# Patient Record
Sex: Female | Born: 1985 | Race: White | Hispanic: Yes | Marital: Married | Smoking: Never smoker
Health system: Southern US, Community
[De-identification: ages and names within clinical notes are randomized; demographics above are authoritative.]

## PROBLEM LIST (undated history)

## (undated) DIAGNOSIS — O009 Unspecified ectopic pregnancy without intrauterine pregnancy: Secondary | ICD-10-CM

## (undated) DIAGNOSIS — J45909 Unspecified asthma, uncomplicated: Secondary | ICD-10-CM

## (undated) HISTORY — PX: NO PAST SURGERIES: SHX2092

---

## 2015-01-11 ENCOUNTER — Encounter (HOSPITAL_COMMUNITY): Payer: Self-pay | Admitting: *Deleted

## 2015-01-11 ENCOUNTER — Inpatient Hospital Stay (HOSPITAL_COMMUNITY): Payer: Self-pay

## 2015-01-11 ENCOUNTER — Inpatient Hospital Stay (HOSPITAL_COMMUNITY)
Admission: AD | Admit: 2015-01-11 | Discharge: 2015-01-11 | Disposition: A | Discharge status: Home / Self Care | Payer: Self-pay | Source: Ambulatory Visit | Attending: Obstetrics & Gynecology | Admitting: Obstetrics & Gynecology

## 2015-01-11 DIAGNOSIS — O4691 Antepartum hemorrhage, unspecified, first trimester: Secondary | ICD-10-CM

## 2015-01-11 DIAGNOSIS — O3680X0 Pregnancy with inconclusive fetal viability, not applicable or unspecified: Secondary | ICD-10-CM

## 2015-01-11 DIAGNOSIS — Z3A01 Less than 8 weeks gestation of pregnancy: Secondary | ICD-10-CM | POA: Insufficient documentation

## 2015-01-11 DIAGNOSIS — R1030 Lower abdominal pain, unspecified: Secondary | ICD-10-CM | POA: Insufficient documentation

## 2015-01-11 DIAGNOSIS — O209 Hemorrhage in early pregnancy, unspecified: Secondary | ICD-10-CM | POA: Insufficient documentation

## 2015-01-11 LAB — CBC
HEMATOCRIT: 37.2 % (ref 36.0–46.0)
HEMOGLOBIN: 12.8 g/dL (ref 12.0–15.0)
MCH: 30.3 pg (ref 26.0–34.0)
MCHC: 34.4 g/dL (ref 30.0–36.0)
MCV: 87.9 fL (ref 78.0–100.0)
Platelets: 259 10*3/uL (ref 150–400)
RBC: 4.23 MIL/uL (ref 3.87–5.11)
RDW: 12.6 % (ref 11.5–15.5)
WBC: 10.7 10*3/uL — AB (ref 4.0–10.5)

## 2015-01-11 LAB — ABO/RH: ABO/RH(D): O POS

## 2015-01-11 LAB — URINALYSIS, ROUTINE W REFLEX MICROSCOPIC
Bilirubin Urine: NEGATIVE
Glucose, UA: NEGATIVE mg/dL
Ketones, ur: NEGATIVE mg/dL
Leukocytes, UA: NEGATIVE
NITRITE: NEGATIVE
PH: 6 (ref 5.0–8.0)
Protein, ur: NEGATIVE mg/dL
UROBILINOGEN UA: 0.2 mg/dL (ref 0.0–1.0)

## 2015-01-11 LAB — WET PREP, GENITAL
TRICH WET PREP: NONE SEEN
YEAST WET PREP: NONE SEEN

## 2015-01-11 LAB — POCT PREGNANCY, URINE: PREG TEST UR: POSITIVE — AB

## 2015-01-11 LAB — URINE MICROSCOPIC-ADD ON

## 2015-01-11 LAB — HCG, QUANTITATIVE, PREGNANCY: hCG, Beta Chain, Quant, S: 2027 m[IU]/mL — ABNORMAL HIGH (ref <5)

## 2015-01-11 NOTE — MAU Note (Signed)
Been bleeding since Saturday, initially was spotting, got heavier yesterday.  Pain in lower abd, started on Sunday.  2 +HPT

## 2015-01-11 NOTE — Discharge Instructions (Signed)
Hemorragia vaginal durante el embarazo (primer trimestre) °(Vaginal Bleeding During Pregnancy, First Trimester) °Durante los primeros meses de embarazo, es común tener una pequeña hemorragia vaginal (manchas). A veces, la hemorragia es normal y no representa un problema, pero en algunas ocasiones es un síntoma de algo grave. Asegúrese de decirle a su médico de inmediato si tiene algún tipo de hemorragia vaginal. °CUIDADOS EN EL HOGAR °· Controle su afección para ver si hay cambios. °· Siga las indicaciones de su médico con respecto al grado de actividad que puede tener. °· Si debe hacer reposo en cama: °¨ Es posible que deba quedarse en cama y levantarse únicamente para ir al baño. °¨ Quizás le permitan hacer algunas actividades. °¨ Si es necesario, planifique que alguien la ayude. °· Escriba: °¨ La cantidad de toallas higiénicas que usa cada día. °¨ La frecuencia con la que se cambia las toallas higiénicas. °¨ Indique que tan empapados (saturados) están. °· No use tampones. °· No se haga duchas vaginales. °· No tenga relaciones sexuales ni orgasmos hasta que el médico la autorice. °· Si elimina tejido por la vagina, guárdelo para mostrárselo al médico. °· Tome los medicamentos solamente como se lo haya indicado el médico. °· No tome aspirina, ya que puede causar hemorragias. °· Concurra a todas las visitas de control como se lo haya indicado el médico. °SOLICITE AYUDA SI:  °· Tiene una hemorragia vaginal. °· Tiene cólicos. °· Tiene dolores de parto. °· Tiene fiebre que no desaparece después de tomar medicamentos. °SOLICITE AYUDA DE INMEDIATO SI:  °· Siente cólicos muy intensos en la espalda o en el vientre (abdomen). °· Elimina coágulos grandes o tejido por la vagina. °· Tiene más hemorragia. °· Se siente débil o que va a desvanecerse. °· Pierde el conocimiento (se desmaya). °· Tiene escalofríos. °· Tiene una pérdida importante o sale líquido a borbotones por la vagina. °· Se desmaya mientras defeca. °ASEGÚRESE DE  QUE: °· Comprende estas instrucciones. °· Controlará su afección. °· Recibirá ayuda de inmediato si no mejora o si empeora. °Document Released: 08/24/2013 °ExitCare® Patient Information ©2015 [employer]. This information is not intended to replace advice given to you by your health care provider. Make sure you discuss any questions you have with your health care provider. ° °

## 2015-01-11 NOTE — MAU Provider Note (Signed)
History     CSN: [id number]  Arrival date and time: 01/11/15 2841   First Provider Initiated Contact with Patient 01/11/15 2118      Chief Complaint  Patient presents with  . Vaginal Bleeding   HPI Ms. Rebekah Stone is a 29 y.o. G1P0 at [redacted]w[redacted]d who presents to MAU today with complaint of vaginal bleeding and lower abdominal cramping since Saturday. She states cramping lower abdominal pain rated at 6/10 now. She has not taken anything for pain. She states possible subjective fever, but also states recent cold symptoms. She denies N/V/D or constipation, UTI symptoms or recent intercourse. She states LMP 12/06/14 and confirmed +UPT at [facility] today.  OB History    Gravida Para Term Preterm AB TAB SAB Ectopic Multiple Living   1               History reviewed. No pertinent past medical history.  Past Surgical History  Procedure Laterality Date  . No past surgeries      History reviewed. No pertinent family history.  Social History  Substance Use Topics  . Smoking status: Never Smoker   . Smokeless tobacco: None  . Alcohol Use: No    Allergies: No Known Allergies  No prescriptions prior to admission    Review of Systems  Constitutional: Negative for fever and malaise/fatigue.  Gastrointestinal: Positive for abdominal pain. Negative for nausea, vomiting, diarrhea and constipation.  Genitourinary: Negative for dysuria, urgency and frequency.       + vaginal bleeding   Physical Exam   Blood pressure 110/62, pulse 85, temperature 98.5 F (36.9 C), temperature source Oral, resp. rate 16, height 5' (1.524 m), weight 137 lb (62.143 kg), last menstrual period 12/06/2014.  Physical Exam  Nursing note and vitals reviewed. Constitutional: She is oriented to person, place, and time. She appears well-developed and well-nourished. No distress.  HENT:  Head: Normocephalic and atraumatic.  Cardiovascular: Normal rate.   Respiratory: Effort normal.  GI: Soft. She exhibits  no distension and no mass. There is no tenderness. There is no rebound and no guarding.  Genitourinary: Uterus is not enlarged and not tender. Cervix exhibits no motion tenderness, no discharge and no friability. Right adnexum displays no mass and no tenderness. Left adnexum displays tenderness (mild). Left adnexum displays no mass. There is bleeding (small amount of dark blood in the vaginal vault) in the vagina. No vaginal discharge found.  Cervix: closed, thick  Neurological: She is alert and oriented to person, place, and time.  Skin: Skin is warm and dry. No erythema.  Psychiatric: She has a normal mood and affect.   Results for orders placed or performed during the hospital encounter of 01/11/15 (from the past 24 hour(s))  Urinalysis, Routine w reflex microscopic (not at [facility])     Status: Abnormal   Collection Time: 01/11/15  3:30 PM  Result Value Ref Range   Color, Urine STRAW (A) YELLOW   APPearance CLEAR CLEAR   Specific Gravity, Urine <1.005 (L) 1.005 - 1.030   pH 6.0 5.0 - 8.0   Glucose, UA NEGATIVE NEGATIVE mg/dL   Hgb urine dipstick MODERATE (A) NEGATIVE   Bilirubin Urine NEGATIVE NEGATIVE   Ketones, ur NEGATIVE NEGATIVE mg/dL   Protein, ur NEGATIVE NEGATIVE mg/dL   Urobilinogen, UA 0.2 0.0 - 1.0 mg/dL   Nitrite NEGATIVE NEGATIVE   Leukocytes, UA NEGATIVE NEGATIVE  Urine microscopic-add on     Status: None   Collection Time: 01/11/15  3:30 PM  Result  Value Ref Range   Squamous Epithelial / LPF RARE RARE   RBC / HPF 0-2 <3 RBC/hpf   Bacteria, UA RARE RARE  Pregnancy, urine POC     Status: Abnormal   Collection Time: 01/11/15  6:11 PM  Result Value Ref Range   Preg Test, Ur POSITIVE (A) NEGATIVE  CBC     Status: Abnormal   Collection Time: 01/11/15  6:23 PM  Result Value Ref Range   WBC 10.7 (H) 4.0 - 10.5 K/uL   RBC 4.23 3.87 - 5.11 MIL/uL   Hemoglobin 12.8 12.0 - 15.0 g/dL   HCT 16.1 09.6 - 04.5 %   MCV 87.9 78.0 - 100.0 fL   MCH 30.3 26.0 - 34.0 pg   MCHC 34.4  30.0 - 36.0 g/dL   RDW 40.9 81.1 - 91.4 %   Platelets 259 150 - 400 K/uL  ABO/Rh     Status: None (Preliminary result)   Collection Time: 01/11/15  6:23 PM  Result Value Ref Range   ABO/RH(D) O POS   hCG, quantitative, pregnancy     Status: Abnormal   Collection Time: 01/11/15  6:23 PM  Result Value Ref Range   hCG, Beta Chain, Quant, S 2027 (H) <5 mIU/mL  Wet prep, genital     Status: Abnormal   Collection Time: 01/11/15  9:25 PM  Result Value Ref Range   Yeast Wet Prep HPF POC NONE SEEN NONE SEEN   Trich, Wet Prep NONE SEEN NONE SEEN   Clue Cells Wet Prep HPF POC FEW (A) NONE SEEN   WBC, Wet Prep HPF POC FEW (A) NONE SEEN   US Ob Comp Less 14 Wks  01/11/2015   CLINICAL DATA:  29 year old female with positive pregnancy test presenting with vaginal bleeding and increasing pelvic pain.  EXAM: OBSTETRIC <14 WK [place] AND TRANSVAGINAL OB US  TECHNIQUE: Both transabdominal and transvaginal ultrasound examinations were performed for complete evaluation of the gestation as well as the maternal uterus, adnexal regions, and pelvic cul-de-sac. Transvaginal technique was performed to assess early pregnancy.  COMPARISON:  None.  FINDINGS: The uterus is anteverted and demonstrates a heterogeneous echotexture. No intrauterine pregnancy identified. In the setting of a positive HCG level and absent intrauterine pregnancy and ectopic pregnancy is not entirely excluded. Correlation with clinical exam and follow-up with serial HCG levels and ultrasound recommended.  A 3.0 x 2.8 x 3.3 cm solid lesion noted in the right uterus most compatible with a fibroid. There is a 1.9 x 1.1 x 1.6 cm heterogeneous and hypoechoic lesion to the left of external cervical os. This lesion demonstrates cystic component with areas of solid tissue or septation. Doppler images demonstrate flow to the solid component/ septation of this lesion. Correlation with gynecological exam recommended. Further evaluation with MRI is advised for further  characterization.  The right ovary measures 4.4 x 2.5 x 2.9 cm and the left ovary measures 4.2 x 2.5 x 0.9 cm. There are bilateral complex/hypoechoic ovarian lesions most likely corpus luteum or hemorrhagic cysts. Ectopic pregnancy less likely. Correlation with clinical exam and location of pain recommended.  Small free fluid within the pelvis  IMPRESSION: No intrauterine pregnancy identified. Ectopic pregnancy not entirely excluded. Correlation with clinical exam and follow-up with serial HCG levels and ultrasound recommended.  Follow-up bilateral corpus luteum and hemorrhagic. Ectopic pregnancy is less likely. Correlation with clinical exam and location of pain recommended.  Complex predominantly hypoechoic lesion to the left of the external os. Correlation with gynecological exam  and MRI recommended.  Right ovarian fibroid.   Electronically Signed   By: Elgie Collard M.D.   On: 01/11/2015 22:49   US Ob Transvaginal  01/11/2015   CLINICAL DATA:  29 year old female with positive pregnancy test presenting with vaginal bleeding and increasing pelvic pain.  EXAM: OBSTETRIC <14 WK [place] AND TRANSVAGINAL OB US  TECHNIQUE: Both transabdominal and transvaginal ultrasound examinations were performed for complete evaluation of the gestation as well as the maternal uterus, adnexal regions, and pelvic cul-de-sac. Transvaginal technique was performed to assess early pregnancy.  COMPARISON:  None.  FINDINGS: The uterus is anteverted and demonstrates a heterogeneous echotexture. No intrauterine pregnancy identified. In the setting of a positive HCG level and absent intrauterine pregnancy and ectopic pregnancy is not entirely excluded. Correlation with clinical exam and follow-up with serial HCG levels and ultrasound recommended.  A 3.0 x 2.8 x 3.3 cm solid lesion noted in the right uterus most compatible with a fibroid. There is a 1.9 x 1.1 x 1.6 cm heterogeneous and hypoechoic lesion to the left of external cervical os. This  lesion demonstrates cystic component with areas of solid tissue or septation. Doppler images demonstrate flow to the solid component/ septation of this lesion. Correlation with gynecological exam recommended. Further evaluation with MRI is advised for further characterization.  The right ovary measures 4.4 x 2.5 x 2.9 cm and the left ovary measures 4.2 x 2.5 x 0.9 cm. There are bilateral complex/hypoechoic ovarian lesions most likely corpus luteum or hemorrhagic cysts. Ectopic pregnancy less likely. Correlation with clinical exam and location of pain recommended.  Small free fluid within the pelvis  IMPRESSION: No intrauterine pregnancy identified. Ectopic pregnancy not entirely excluded. Correlation with clinical exam and follow-up with serial HCG levels and ultrasound recommended.  Follow-up bilateral corpus luteum and hemorrhagic. Ectopic pregnancy is less likely. Correlation with clinical exam and location of pain recommended.  Complex predominantly hypoechoic lesion to the left of the external os. Correlation with gynecological exam and MRI recommended.  Right ovarian fibroid.   Electronically Signed   By: Elgie Collard M.D.   On: 01/11/2015 22:49    MAU Course  Procedures None  MDM +UPT UA, wet prep, GC/chlamydia, CBC, ABO/Rh, quant hCG, HIV, RPR and [place] today to rule out ectopic pregnancy Discussed patient including labs, exam and [place] with Dr. Erin Fulling. She agrees with plan to discharge at this time with ectopic precautions and follow-up for repeat labs in 48 hours.  Assessment and Plan  A: Pregnancy of unknown location Vaginal bleeding in pregnancy Abdominal pain in pregnancy  P: Discharge home Tylenol PRN for pain advised Ectopic/bleeding precautions discussed Patient advised to follow-up in MAU in 48 hours for repeat labs or sooner if her condition were to change or worsen   Marny Lowenstein, PA-C  01/11/2015, 11:02 PM

## 2015-01-12 LAB — GC/CHLAMYDIA PROBE AMP (~~LOC~~) NOT AT ARMC
Chlamydia: NEGATIVE
NEISSERIA GONORRHEA: NEGATIVE

## 2015-01-12 LAB — HIV ANTIBODY (ROUTINE TESTING W REFLEX): HIV Screen 4th Generation wRfx: NONREACTIVE

## 2015-01-13 ENCOUNTER — Inpatient Hospital Stay (HOSPITAL_COMMUNITY)
Admission: AD | Admit: 2015-01-13 | Discharge: 2015-01-13 | Disposition: A | Discharge status: Home / Self Care | Payer: Self-pay | Source: Ambulatory Visit | Attending: Obstetrics & Gynecology | Admitting: Obstetrics & Gynecology

## 2015-01-13 DIAGNOSIS — O209 Hemorrhage in early pregnancy, unspecified: Secondary | ICD-10-CM | POA: Insufficient documentation

## 2015-01-13 DIAGNOSIS — O26899 Other specified pregnancy related conditions, unspecified trimester: Secondary | ICD-10-CM

## 2015-01-13 DIAGNOSIS — Z3A01 Less than 8 weeks gestation of pregnancy: Secondary | ICD-10-CM | POA: Insufficient documentation

## 2015-01-13 DIAGNOSIS — O9989 Other specified diseases and conditions complicating pregnancy, childbirth and the puerperium: Secondary | ICD-10-CM

## 2015-01-13 DIAGNOSIS — R109 Unspecified abdominal pain: Secondary | ICD-10-CM | POA: Insufficient documentation

## 2015-01-13 LAB — HCG, QUANTITATIVE, PREGNANCY: hCG, Beta Chain, Quant, S: 3918 m[IU]/mL — ABNORMAL HIGH (ref <5)

## 2015-01-13 NOTE — Discharge Instructions (Signed)
Dolor abdominal en el embarazo °(Abdominal Pain During Pregnancy) °El dolor abdominal es frecuente durante el embarazo. Generalmente no causa ningún daño. El dolor abdominal puede tener numerosas causas. Algunas causas son más graves que otras. Ciertas causas de dolor abdominal durante el embarazo se diagnostican fácilmente. A veces, se tarda un tiempo para llegar al diagnóstico. Otras veces la causa no se conoce. El dolor abdominal puede estar relacionado con alguna alteración del embarazo, o puede deberse a una causa totalmente diferente. Por este motivo, siempre consulte a su médico cuando sienta molestias abdominales. °INSTRUCCIONES PARA EL CUIDADO EN EL HOGAR  °Esté atenta al dolor para ver si hay cambios. Las siguientes indicaciones ayudarán a aliviar cualquier molestia que pueda sentir: °· No tenga relaciones sexuales y no coloque nada dentro de la vagina hasta que los síntomas hayan desaparecido completamente. °· Descanse todo lo que pueda hasta que el dolor se le haya calmado. °· Si siente náuseas, beba líquidos claros. Evite los alimentos sólidos mientras sienta malestar o tenga náuseas. °· Tome sólo medicamentos de venta libre o recetados, según las indicaciones del médico. °· Cumpla con todas las visitas de control, según le indique su médico. °SOLICITE ATENCIÓN MÉDICA DE INMEDIATO SI: °· Tiene un sangrado, pérdida de líquidos o elimina tejidos por la vagina. °· El dolor o los cólicos aumentan. °· Tiene vómitos persistentes. °· Comienza a sentir dolor al orinar u observa sangre. °· Tiene fiebre. °· Nota que los movimientos del bebé disminuyen. °· Siente intensa debilidad o se marea. °· Tiene dificultad para respirar con o sin dolor abdominal. °· Siente un dolor de cabeza intenso junto al dolor abdominal. °· Tiene una secreción vaginal anormal con dolor abdominal. °· Tiene diarrea persistente. °· El dolor abdominal sigue o empeora aún después de hacer reposo. °ASEGÚRESE DE QUE:  °· Comprende estas  instrucciones. °· Controlará su afección. °· Recibirá ayuda de inmediato si no mejora o si empeora. °Document Released: 04/09/2005 Document Revised: 01/28/2013 °ExitCare® Patient Information ©2015 [employer]. This information is not intended to replace advice given to you by your health care provider. Make sure you discuss any questions you have with your health care provider. ° °

## 2015-01-13 NOTE — MAU Provider Note (Signed)
S:  [name] is a 29 y.o. female G1P0 at [redacted]w[redacted]d she is here for a follow up beta hcg level. She presented 2 days ago for vaginal bleeding and lower abdominal pain.    She currently rates her pain 6/10; the pain is exactly the same as it was 2 days ago; the pain has not intensified. She continues to have light vaginal bleeding.    O:  GENERAL: Well-developed, well-nourished female in no acute distress.  LUNGS: Effort normal SKIN: Warm, dry and without erythema ABDOMEN: soft, non tender. No rebound, no guarding.  PSYCH: Normal mood and affect  Filed Vitals:   01/13/15 1221  BP: 110/62  Pulse: 96  Resp: 18   US Ob Comp Less 14 Wks  01/11/2015   CLINICAL DATA:  29 year old female with positive pregnancy test presenting with vaginal bleeding and increasing pelvic pain.  EXAM: OBSTETRIC <14 WK [place] AND TRANSVAGINAL OB US  TECHNIQUE: Both transabdominal and transvaginal ultrasound examinations were performed for complete evaluation of the gestation as well as the maternal uterus, adnexal regions, and pelvic cul-de-sac. Transvaginal technique was performed to assess early pregnancy.  COMPARISON:  None.  FINDINGS: The uterus is anteverted and demonstrates a heterogeneous echotexture. No intrauterine pregnancy identified. In the setting of a positive HCG level and absent intrauterine pregnancy and ectopic pregnancy is not entirely excluded. Correlation with clinical exam and follow-up with serial HCG levels and ultrasound recommended.  A 3.0 x 2.8 x 3.3 cm solid lesion noted in the right uterus most compatible with a fibroid. There is a 1.9 x 1.1 x 1.6 cm heterogeneous and hypoechoic lesion to the left of external cervical os. This lesion demonstrates cystic component with areas of solid tissue or septation. Doppler images demonstrate flow to the solid component/ septation of this lesion. Correlation with gynecological exam recommended. Further evaluation with MRI is advised for further  characterization.  The right ovary measures 4.4 x 2.5 x 2.9 cm and the left ovary measures 4.2 x 2.5 x 0.9 cm. There are bilateral complex/hypoechoic ovarian lesions most likely corpus luteum or hemorrhagic cysts. Ectopic pregnancy less likely. Correlation with clinical exam and location of pain recommended.  Small free fluid within the pelvis  IMPRESSION: No intrauterine pregnancy identified. Ectopic pregnancy not entirely excluded. Correlation with clinical exam and follow-up with serial HCG levels and ultrasound recommended.  Follow-up bilateral corpus luteum and hemorrhagic. Ectopic pregnancy is less likely. Correlation with clinical exam and location of pain recommended.  Complex predominantly hypoechoic lesion to the left of the external os. Correlation with gynecological exam and MRI recommended.  Right ovarian fibroid.   Electronically Signed   By: Elgie Collard M.D.   On: 01/11/2015 22:49   US Ob Transvaginal  01/11/2015   CLINICAL DATA:  29 year old female with positive pregnancy test presenting with vaginal bleeding and increasing pelvic pain.  EXAM: OBSTETRIC <14 WK [place] AND TRANSVAGINAL OB US  TECHNIQUE: Both transabdominal and transvaginal ultrasound examinations were performed for complete evaluation of the gestation as well as the maternal uterus, adnexal regions, and pelvic cul-de-sac. Transvaginal technique was performed to assess early pregnancy.  COMPARISON:  None.  FINDINGS: The uterus is anteverted and demonstrates a heterogeneous echotexture. No intrauterine pregnancy identified. In the setting of a positive HCG level and absent intrauterine pregnancy and ectopic pregnancy is not entirely excluded. Correlation with clinical exam and follow-up with serial HCG levels and ultrasound recommended.  A 3.0 x 2.8 x 3.3 cm solid lesion noted in the right  uterus most compatible with a fibroid. There is a 1.9 x 1.1 x 1.6 cm heterogeneous and hypoechoic lesion to the left of external cervical os. This  lesion demonstrates cystic component with areas of solid tissue or septation. Doppler images demonstrate flow to the solid component/ septation of this lesion. Correlation with gynecological exam recommended. Further evaluation with MRI is advised for further characterization.  The right ovary measures 4.4 x 2.5 x 2.9 cm and the left ovary measures 4.2 x 2.5 x 0.9 cm. There are bilateral complex/hypoechoic ovarian lesions most likely corpus luteum or hemorrhagic cysts. Ectopic pregnancy less likely. Correlation with clinical exam and location of pain recommended.  Small free fluid within the pelvis  IMPRESSION: No intrauterine pregnancy identified. Ectopic pregnancy not entirely excluded. Correlation with clinical exam and follow-up with serial HCG levels and ultrasound recommended.  Follow-up bilateral corpus luteum and hemorrhagic. Ectopic pregnancy is less likely. Correlation with clinical exam and location of pain recommended.  Complex predominantly hypoechoic lesion to the left of the external os. Correlation with gynecological exam and MRI recommended.  Right ovarian fibroid.   Electronically Signed   By: Elgie Collard M.D.   On: 01/11/2015 22:49    MDM:  Beta hcg level 9/20: 2027 Beta hcg level 9/22: 3918  A:  1. Abdominal pain in pregnancy   2. Vaginal bleeding in pregnancy, first trimester     P:  Discharge home in stable condition Return in 48 hours for repeat Quant. Rebekah Stone has risen appropriately >60% however patient continues to have pain and bleeding. May consider repeat US in 48 hours Strict ectopic precautions Pelvic rest Return to MAU if symptoms worsen    Duane Lope, NP 01/13/2015 2:46 PM

## 2015-01-13 NOTE — MAU Note (Signed)
Pt reports she is here for BHCG. C/O pain and reports bleeding is less.

## 2015-01-15 ENCOUNTER — Encounter (HOSPITAL_COMMUNITY): Payer: Self-pay | Admitting: Student

## 2015-01-15 ENCOUNTER — Inpatient Hospital Stay (HOSPITAL_COMMUNITY)
Admission: AD | Admit: 2015-01-15 | Discharge: 2015-01-15 | Disposition: A | Discharge status: Home / Self Care | Payer: Self-pay | Source: Ambulatory Visit | Attending: Obstetrics and Gynecology | Admitting: Obstetrics and Gynecology

## 2015-01-15 ENCOUNTER — Inpatient Hospital Stay (HOSPITAL_COMMUNITY): Payer: Self-pay

## 2015-01-15 DIAGNOSIS — O009 Unspecified ectopic pregnancy without intrauterine pregnancy: Secondary | ICD-10-CM

## 2015-01-15 DIAGNOSIS — O3680X0 Pregnancy with inconclusive fetal viability, not applicable or unspecified: Secondary | ICD-10-CM

## 2015-01-15 DIAGNOSIS — O26899 Other specified pregnancy related conditions, unspecified trimester: Secondary | ICD-10-CM

## 2015-01-15 DIAGNOSIS — R1032 Left lower quadrant pain: Secondary | ICD-10-CM | POA: Insufficient documentation

## 2015-01-15 HISTORY — DX: Unspecified ectopic pregnancy without intrauterine pregnancy: O00.90

## 2015-01-15 LAB — CBC
HEMATOCRIT: 35.6 % — AB (ref 36.0–46.0)
HEMOGLOBIN: 12.6 g/dL (ref 12.0–15.0)
MCH: 30.9 pg (ref 26.0–34.0)
MCHC: 35.4 g/dL (ref 30.0–36.0)
MCV: 87.3 fL (ref 78.0–100.0)
Platelets: 284 10*3/uL (ref 150–400)
RBC: 4.08 MIL/uL (ref 3.87–5.11)
RDW: 12.5 % (ref 11.5–15.5)
WBC: 9.6 10*3/uL (ref 4.0–10.5)

## 2015-01-15 LAB — COMPREHENSIVE METABOLIC PANEL
ALK PHOS: 47 U/L (ref 38–126)
ALT: 13 U/L — AB (ref 14–54)
AST: 16 U/L (ref 15–41)
Albumin: 4.2 g/dL (ref 3.5–5.0)
Anion gap: 5 (ref 5–15)
BUN: 9 mg/dL (ref 6–20)
CALCIUM: 8.9 mg/dL (ref 8.9–10.3)
CO2: 23 mmol/L (ref 22–32)
CREATININE: 0.59 mg/dL (ref 0.44–1.00)
Chloride: 108 mmol/L (ref 101–111)
Glucose, Bld: 96 mg/dL (ref 65–99)
Potassium: 3.6 mmol/L (ref 3.5–5.1)
Sodium: 136 mmol/L (ref 135–145)
Total Bilirubin: 0.3 mg/dL (ref 0.3–1.2)
Total Protein: 7.5 g/dL (ref 6.5–8.1)

## 2015-01-15 LAB — HCG, QUANTITATIVE, PREGNANCY: hCG, Beta Chain, Quant, S: 3447 m[IU]/mL — ABNORMAL HIGH (ref <5)

## 2015-01-15 MED ORDER — METHOTREXATE INJECTION FOR WOMEN'S HOSPITAL
50.0000 mg/m2 | Freq: Once | INTRAMUSCULAR | Status: AC
  Administered 2015-01-15: 80 mg via INTRAMUSCULAR
  Filled 2015-01-15: qty 1.6

## 2015-01-15 NOTE — Progress Notes (Addendum)
Wallene Huh, Spanish interpreter in room

## 2015-01-15 NOTE — MAU Note (Signed)
Rebekah Stone spanish interpreter in room with RN reviewing follow up instructions, discussed to only take tylenol for pain and reasons to return sooner. Pt voiced understanding.

## 2015-01-15 NOTE — MAU Note (Signed)
Pt presents to MAU for repeat BHCG. Denies any vaginal bleeding or discharge. Reports pain in her left lower side

## 2015-01-15 NOTE — Discharge Instructions (Signed)
Embarazo ectpico ( Ectopic Pregnancy) Un embarazo ectpico ocurre cuando el vulo fertilizado se fija (implanta) fuera del tero. La mayora de los embarazos ectpicos se producen en la trompa de Andres. Es raro que ocurran en el ovario, el intestino, la pelvis o el cuello uterino. En un embarazo ectpico, el vulo fertilizado no tiene la capacidad de llegar a ser un beb normal y sano.  Neomia Dear ruptura en el embarazo ectpico se produce cuando la trompa de Falopio se desgarra o se rompe y Futures trader como resultado una hemorragia interna. A menudo hay un intenso dolor abdominal y en algunos casos sangrado vaginal. Tener un embarazo ectpico puede ser Neomia Dear experiencia que pone en peligro la vida. Si no se trata, esta grave afeccin puede requerir una transfusin de [place], ciruga abdominal, o incluso causar la muerte. CAUSAS  En la [employer] de los casos se sospecha un dao en las trompas de Falopio como causa del Multimedia programmer.  FACTORES DE RIESGO Dependiendo de sus circunstancias, el nivel de riesgo de tener un embarazo ectpico variar. [place] de riesgo puede dividirse en tres categoras. Alto Riesgo  Usted ha realizado tratamientos para la infertilidad.  Ha tenido un embarazo ectpico previo.  Fue sometida a [place] de trompas.  Tuvo una ciruga previa para ligar las trompas de Falopio (ligadura de trompas).  Tiene problemas o enfermedades en las trompas.  Ha estado expuesta al DES. El DES es un medicamento que se [place] [employer] 1971 y [employer] en los bebs cuyas madres lo tomaron.  Queda embarazada mientras [place] un dispositivo intrauterino (DIU) como mtodo anticonceptivo. Riesgo moderado  Tiene antecedentes de infertilidad.  Ha sufrido alguna enfermedad de transmisin sexual (ETS).  Tiene antecedentes de enfermedad plvica inflamatoria (EPI).  Tiene cicatrices por endometriosis.  Tiene mltiples parejas sexuales.  Fuma. Bajo riesgo  Fue sometida a una ciruga  plvica.  [place] duchas vaginales.  Comenz a ser [employer] de los 18 aos de [place]. SIGNOS Y SNTOMAS  El embarazo ectpico se debe sospechar en cualquier mujer a la que le ha faltado un perodo y tiene dolor abdominal o sangrado.  Puede experimentar sntomas normales de Psychiatrist, tales como:  Nuseas.  Cansancio.  Inflamacin mamaria.  Otros sntomas son:  Futures trader.  Hemorragia vaginal o manchado irregular.  Clicos o dolor en uno de los lados o en la zona inferior del abdomen.  Latidos cardacos acelerados.  Desmayarse al defecar.  Los sntomas de un embarazo ectpico con ruptura y hemorragias internas son:  Dolor intenso y sbito en el abdomen y la pelvis.  Mareos o [employer].  Dolor en la zona del hombro. DIAGNSTICO  Los exmenes que se indicarn son:  Test de [place].  Una ecografa.  Prueba de nivel especfico de hormona del embarazo en el torrente sanguneo.  Extraccin de [place] de tejido del tero (dilatacin y curetaje, D y C).  Ciruga para Charity fundraiser visual del interior del abdomen usando un tubo delgado, que emite luz con una pequea cmara en el extremo (laparoscopio). TRATAMIENTO  Se podr aplicar una inyeccin de un medicamento denominado metotrexato. Este medicamento hace que se absorba el tejido del [place]. Se administra en los siguientes casos:  Hay un diagnstico temprano.  La trompa de Falopio no se ha roto.  Se la considera una buena candidata para recibir Research scientist (medical). Por lo general, despus del tratamiento con metotrexato se comprueban los niveles de hormonas del [place]. Esto se hace para asegurarse de que  el medicamento es [place]. Puede llevar entre 4 y 6 semanas para que el Psychiatrist sea absorbido (aunque la mayora de los embarazos se Environmental health practitioner a Agricultural engineer). Puede ser necesario realizar un tratamiento quirrgico. Se puede utilizar un laparoscopio para retirar el tejido del  Psychiatrist. Si hay una hemorragia interna grave, se hace un corte (incisin) en la zona inferior del abdomen (laparotoma) y se extirpa el embarazo ectpico. De este modo de detendr el sangrado. Es posible que tambin se extirpe parte de la trompa de Falopio o la trompa entera (salpingectoma). Luego de la [place], le harn un anlisis de hormona del embarazo para asegurarse de que no quedan tejidos. Quizs reciba la vacuna de inmunoglobulina Rho(D) si usted es Rh negativa y el padre es Rh positivo, o si no conoce el tipo de Rh del padre. Esto evita problemas en prximos embarazos. SOLICITE ATENCIN MDICA DE INMEDIATO SI:  Tiene sntomas de embarazo ectpico. Esto es una emergencia mdica. ASEGRESE DE QUE:  Comprende estas instrucciones.  Controlar su afeccin.  Recibir ayuda de inmediato si no mejora o si empeora. Document Released: 04/09/2005 Document Revised: 04/14/2013 [facility] Patient Information 2015 [place], [place]. This information is not intended to replace advice given to you by your health care provider. Make sure you discuss any questions you have with your health care provider.   Tratamiento con metotrexato para Audiological scientist, cuidados posteriores (Methotrexate Treatment for an Ectopic Pregnancy, Care After) Siga estas indicaciones durante las prximas semanas. Estas indicaciones le proporcionan informacin general acerca de cmo deber cuidarse despus del procedimiento. El mdico tambin podr darle indicaciones ms especficas. El tratamiento se ha planificado de acuerdo a las prcticas mdicas actuales, pero a veces pueden ocurrir problemas. Comunquese con el mdico si tiene algn problema o tiene dudas despus del procedimiento. QU ESPERAR DESPUS DEL PROCEDIMIENTO Puede tener algunos clicos abdominales, hemorragia vaginal y fatiga durante los primeros das despus de recibir Air cabin crew. Otros efectos secundarios posibles del metotrexato  incluyen:  Nuseas.  Vmitos.  Diarrea.  Llagas en la boca.  Hinchazn o irritacin del revestimiento pulmonar (neumonitis).  Dao heptico.  Prdida del cabello. INSTRUCCIONES PARA EL CUIDADO EN EL HOGAR  Luego de recibir [employer], debe tener cuidado con las actividades que [place], y Chief Operating Officer su afeccin durante algunas semanas. Puede pasar 1semana antes de que los niveles hormonales se normalicen.  [place] con todas las visitas de control, segn le indique su mdico.  Evite viajar a sitios alejados de donde est su mdico.  No tenga relaciones sexuales hasta que el mdico le diga que es seguro.  Puede retomar su dieta habitual.  Limite las actividades extenuantes.  No tome cido flico, vitaminas prenatales ni otras vitaminas que contengan cido flico.  No tome aspirina, ibuprofeno ni naproxeno (antiinflamatorios no esteroides [AINE]).  No beba alcohol. SOLICITE ATENCIN MDICA SI:   No puede controlar las nuseas y los vmitos.  No puede controlar la diarrea.  Tiene llagas en la boca y [place].  Necesita analgsicos ms fuertes para su dolor.  Tiene una erupcin cutnea.  Tiene una reaccin adversa a los medicamentos. SOLICITE ATENCIN MDICA DE INMEDIATO SI:   Tiene cada vez ms dolor abdominal o plvico.  Observa un aumento de la hemorragia.  Se siente mareada o se desmaya.  Le falta el aire.  Aumenta su frecuencia cardaca.  Tiene tos.  Tiene escalofros.  Tiene fiebre. Document Released: 03/29/2011 Document Revised: 04/14/2013 [facility] Patient Information 2015 [place], [place]. This information is not intended to replace advice  given to you by your health care provider. Make sure you discuss any questions you have with your health care provider.

## 2015-01-15 NOTE — MAU Provider Note (Signed)
History   [id number]   Chief Complaint  Patient presents with  . Follow-up    bhcg  . Abdominal Pain    HPI Rebekah Stone is a 29 y.o. female G1P0 here for follow-up BHCG.  Upon review of the records patient was first seen on 9/20 for vaginal bleeding. BHCG on that day was 2027. Ultrasound showed no evidence of pregnancy. GC/CT and wet prep were collected. Results were negative. Last seen in MAU on 9/22. BHCG was 3918.   Reports continued LLQ pain and minimal amount of vaginal bleeding. Abdominal pain has not worsened.   Spanish speaking patient; interpreter at bedside.  Patient's last menstrual period was 12/06/2014.  OB History  Gravida Para Term Preterm AB SAB TAB Ectopic Multiple Living  1             # Outcome Date GA Lbr Len/2nd Weight Sex Delivery Anes PTL Lv  1 Current               History reviewed. No pertinent past medical history.  No family history on file.  Social History   Social History  . Marital Status: Married    Spouse Name: N/A  . Number of Children: N/A  . Years of Education: N/A   Social History Main Topics  . Smoking status: Never Smoker   . Smokeless tobacco: None  . Alcohol Use: No  . Drug Use: No  . Sexual Activity: Not Asked   Other Topics Concern  . None   Social History Narrative    No Known Allergies  No current facility-administered medications on file prior to encounter.   No current outpatient prescriptions on file prior to encounter.     Physical Exam   Filed Vitals:   01/15/15 1305  BP: 116/68  Pulse: 89  Temp: 98.4 F (36.9 C)  Resp: 18    Physical Exam  Nursing note and vitals reviewed. Constitutional: She is oriented to person, place, and time. She appears well-developed and well-nourished. No distress.  HENT:  Head: Normocephalic and atraumatic.  Eyes: Conjunctivae are normal. Right eye exhibits no discharge. Left eye exhibits no discharge. No scleral icterus.  Neck: Normal range of motion.   Cardiovascular: Normal rate.   Respiratory: Effort normal. No respiratory distress.  Neurological: She is alert and oriented to person, place, and time.  Skin: Skin is warm and dry. She is not diaphoretic.  Psychiatric: She has a normal mood and affect. Her behavior is normal. Judgment and thought content normal.    MAU Course  Procedures Results for orders placed or performed during the hospital encounter of 01/15/15 (from the past 24 hour(s))  hCG, quantitative, pregnancy     Status: Abnormal   Collection Time: 01/15/15 11:48 AM  Result Value Ref Range   hCG, Beta Chain, Quant, S 3447 (H) <5 mIU/mL  CBC     Status: Abnormal   Collection Time: 01/15/15  2:50 PM  Result Value Ref Range   WBC 9.6 4.0 - 10.5 K/uL   RBC 4.08 3.87 - 5.11 MIL/uL   Hemoglobin 12.6 12.0 - 15.0 g/dL   HCT 86.5 (L) 78.4 - 69.6 %   MCV 87.3 78.0 - 100.0 fL   MCH 30.9 26.0 - 34.0 pg   MCHC 35.4 30.0 - 36.0 g/dL   RDW 29.5 28.4 - 13.2 %   Platelets 284 150 - 400 K/uL  Comprehensive metabolic panel     Status: Abnormal   Collection Time: 01/15/15  2:50  PM  Result Value Ref Range   Sodium 136 135 - 145 mmol/L   Potassium 3.6 3.5 - 5.1 mmol/L   Chloride 108 101 - 111 mmol/L   CO2 23 22 - 32 mmol/L   Glucose, Bld 96 65 - 99 mg/dL   BUN 9 6 - 20 mg/dL   Creatinine, Ser 9.81 0.44 - 1.00 mg/dL   Calcium 8.9 8.9 - 19.1 mg/dL   Total Protein 7.5 6.5 - 8.1 g/dL   Albumin 4.2 3.5 - 5.0 g/dL   AST 16 15 - 41 U/L   ALT 13 (L) 14 - 54 U/L   Alkaline Phosphatase 47 38 - 126 U/L   Total Bilirubin 0.3 0.3 - 1.2 mg/dL   GFR calc non Af Amer >60 >60 mL/min   GFR calc Af Amer >60 >60 mL/min   Anion gap 5 5 - 15   US Ob Transvaginal  01/15/2015   CLINICAL DATA:  Pregnant, left lower quadrant pain  EXAM: TRANSVAGINAL OB ULTRASOUND  TECHNIQUE: Transvaginal ultrasound was performed for complete evaluation of the gestation as well as the maternal uterus, adnexal regions, and pelvic cul-de-sac.  COMPARISON:  01/11/2015   FINDINGS: Intrauterine gestational sac: Not visualized  Maternal uterus/adnexae: Left ovary is poorly visualized but within normal limits.  Right ovary is within normal limits.  Adjacent 2.1 x 1.3 x 1.3 cm complex right adnexal mass, superolateral to the right ovary (image 45), with associated peripheral color Doppler flow.  Trace right pelvic ascites.  IMPRESSION: No IUP is visualized.  2.1 cm complex right adnexal mass, highly suspicious for ectopic pregnancy. No findings to suggest rupture.  These results were called by telephone at the time of interpretation on 01/15/2015 at 2:35 pm to Judeth Horn, NP, who verbally acknowledged these results.   Electronically Signed   By: Charline Bills M.D.   On: 01/15/2015 14:35    MDM O positive Ultrasound - 2cm right adnexal mass 1459- C/w Dr. Jolayne Panther; discussed labs & ultrasound. Will give MTX Discussed results with patient and treatment with methotrexate. Patient does not drive but her husband does and states she will be reliable to come back for f/u labs or as needed. Spanish interpreter used.  Pt reports continued abdominal discomfort; no apparent distress, no change in pain.   Assessment and Plan  29 y.o. G1P0 at [redacted]w[redacted]d wks Pregnancy 1. Ectopic pregnancy without intrauterine pregnancy   2. Pregnancy of unknown anatomic location   3. Pregnancy with abdominal pain of left lower quadrant, antepartum    P: Discharge home Strict precautions given for return with s/s of ruptured ectopic Return to MAU on day 4 & 7 for f/u BHCG Comfort pack given  Judeth Horn, NP 01/15/2015 1:11 PM

## 2015-01-18 ENCOUNTER — Inpatient Hospital Stay (HOSPITAL_COMMUNITY)
Admission: AD | Admit: 2015-01-18 | Discharge: 2015-01-18 | Disposition: A | Discharge status: Home / Self Care | Payer: Self-pay | Source: Ambulatory Visit | Attending: Family Medicine | Admitting: Family Medicine

## 2015-01-18 DIAGNOSIS — R1032 Left lower quadrant pain: Secondary | ICD-10-CM | POA: Insufficient documentation

## 2015-01-18 DIAGNOSIS — O009 Unspecified ectopic pregnancy without intrauterine pregnancy: Secondary | ICD-10-CM

## 2015-01-18 LAB — HCG, QUANTITATIVE, PREGNANCY: HCG, BETA CHAIN, QUANT, S: 3917 m[IU]/mL — AB (ref <5)

## 2015-01-18 NOTE — Discharge Instructions (Signed)
Tratamiento con metotrexato para Audiological scientist, cuidados posteriores (Methotrexate Treatment for an Ectopic Pregnancy, Care After) Siga estas indicaciones durante las prximas semanas. Estas indicaciones le proporcionan informacin general acerca de cmo deber cuidarse despus del procedimiento. El mdico tambin podr darle indicaciones ms especficas. El tratamiento se ha planificado de acuerdo a las prcticas mdicas actuales, pero a veces pueden ocurrir problemas. Comunquese con el mdico si tiene algn problema o tiene dudas despus del procedimiento. QU ESPERAR DESPUS DEL PROCEDIMIENTO Puede tener algunos clicos abdominales, hemorragia vaginal y fatiga durante los primeros das despus de recibir Air cabin crew. Otros efectos secundarios posibles del metotrexato incluyen:  Nuseas.  Vmitos.  Diarrea.  Llagas en la boca.  Hinchazn o irritacin del revestimiento pulmonar (neumonitis).  Dao heptico.  Prdida del cabello. INSTRUCCIONES PARA EL CUIDADO EN EL HOGAR  Luego de recibir [employer], debe tener cuidado con las actividades que [place], y Chief Operating Officer su afeccin durante algunas semanas. Puede pasar 1semana antes de que los niveles hormonales se normalicen.  Cumpla con todas las visitas de control, segn le indique su mdico.  Evite viajar a sitios alejados de donde est su mdico.  No tenga relaciones sexuales hasta que el mdico le diga que es seguro.  Puede retomar su dieta habitual.  Limite las actividades extenuantes.  No tome cido flico, vitaminas prenatales ni otras vitaminas que contengan cido flico.  No tome aspirina, ibuprofeno ni naproxeno (antiinflamatorios no esteroides [AINE]).  No beba alcohol. SOLICITE ATENCIN MDICA SI:   No puede controlar las nuseas y los vmitos.  No puede controlar la diarrea.  Tiene llagas en la boca y [place].  Necesita analgsicos ms fuertes para su dolor.  Tiene una erupcin  cutnea.  Tiene una reaccin adversa a los medicamentos. SOLICITE ATENCIN MDICA DE INMEDIATO SI:   Tiene cada vez ms dolor abdominal o plvico.  Observa un aumento de la hemorragia.  Se siente mareada o se desmaya.  Le falta el aire.  Aumenta su frecuencia cardaca.  Tiene tos.  Tiene escalofros.  Tiene fiebre. Document Released: 03/29/2011 Document Revised: 04/14/2013 [facility] Patient Information 2015 [place], [place]. This information is not intended to replace advice given to you by your health care provider. Make sure you discuss any questions you have with your health care provider.

## 2015-01-18 NOTE — MAU Note (Signed)
Day 4 post MTX.  Feels normal.  Had some pain and light bleeding this morning.

## 2015-01-18 NOTE — MAU Provider Note (Signed)
History   [id number]   Chief Complaint  Patient presents with  . Follow-up    HPI Rebekah Stone is a 29 y.o. female G1P0010 here for follow-up BHCG.  Upon review of the records patient was first seen on 9/20 for vaginal bleeding & abdominal pain. BHCG on that day was 2027.  Ultrasound showed no pregnancy. GC/CT and wet prep were collected.  Results were negative.   Pt discharged home to follow up for serial BHCG (see below for results). Ultrasound on 9/24 shows 2.1 cm complex right adnexal mass. Methotrexate given on 9/24. Patient presents today for day 4 BHCG. Reports mild LLQ pain that she gets with every period and pink spotting.  Spanish speaking patient, interpreter at bedside.    Patient's last menstrual period was 12/06/2014.  OB History  Gravida Para Term Preterm AB SAB TAB Ectopic Multiple Living  # Outcome Date GA Lbr Len/2nd Weight Sex Delivery Anes PTL Lv  1 Ectopic               Past Medical History  Diagnosis Date  . Ectopic pregnancy     Treated w/ MTX 12/2014    No family history on file.  Social History   Social History  . Marital Status: Married    Spouse Name: N/A  . Number of Children: N/A  . Years of Education: N/A   Social History Main Topics  . Smoking status: Never Smoker   . Smokeless tobacco: Not on file  . Alcohol Use: No  . Drug Use: No  . Sexual Activity: Yes    Birth Control/ Protection: None   Other Topics Concern  . Not on file   Social History Narrative    No Known Allergies  No current facility-administered medications on file prior to encounter.   No current outpatient prescriptions on file prior to encounter.     Physical Exam   Filed Vitals:   01/18/15 1235  BP: 104/60  Pulse: 73  Temp: 98.3 F (36.8 C)  TempSrc: Oral  Resp: 18    Physical Exam  Nursing note and vitals reviewed. Constitutional: She is oriented to person, place, and time. She appears well-developed and  well-nourished. No distress.  HENT:  Head: Normocephalic and atraumatic.  Eyes: Conjunctivae are normal. Right eye exhibits no discharge. Left eye exhibits no discharge. No scleral icterus.  Neck: Normal range of motion.  Cardiovascular: Normal rate.   Respiratory: Effort normal. No respiratory distress.  Neurological: She is alert and oriented to person, place, and time.  Skin: Skin is warm and dry. She is not diaphoretic.  Psychiatric: She has a normal mood and affect. Her behavior is normal. Judgment and thought content normal.    MAU Course  Procedures      Ref Range 11:10 AM  3d ago  5d ago  7d ago     hCG, Beta Chain, Quant, S <5 mIU/mL 3917 (H) 3447 (H)CM 3918 (H)CM 2027 (H)CM   Comments:      GEST. AGE   CONC. (mIU/mL)   <=1 WEEK    5 - 50    2 WEEKS    50 - 500    3 WEEKS    100 - 10,000    4 WEEKS   1,000 - 30,000    5 WEEKS   3,500 - 115,000   6-8 WEEKS   12,000 - 270,000   12 WEEKS  15,000 - 220,000      FEMALE AND NON-PREGNANT FEMALE:    LESS THAN 5 mIU/mL          MDM Stressed importance of patient returning for day 7 labs or sooner PRN increased abdominal pain or heavy vaginal bleeding.   Assessment and Plan  29 y.o. G1P0010 at [redacted]w[redacted]d wks Pregnancy Follow-up BHCG Ectopic pregnancy without intrauterine pregnancy - Plan: Discharge patient   Return on 9/30 for day 8 BHCG.  Discussed reasons to return to MAU for s/s of ruptured ectopic.    Judeth Horn, NP 01/18/2015 12:43 PM

## 2015-01-21 ENCOUNTER — Inpatient Hospital Stay (HOSPITAL_COMMUNITY): Payer: Self-pay | Admitting: Anesthesiology

## 2015-01-21 ENCOUNTER — Ambulatory Visit (HOSPITAL_COMMUNITY)
Admission: AD | Admit: 2015-01-21 | Discharge: 2015-01-21 | Disposition: A | Discharge status: Home / Self Care | Payer: Self-pay | Source: Ambulatory Visit | Attending: Family Medicine | Admitting: Family Medicine

## 2015-01-21 ENCOUNTER — Inpatient Hospital Stay (HOSPITAL_COMMUNITY): Payer: Self-pay

## 2015-01-21 ENCOUNTER — Encounter (HOSPITAL_COMMUNITY)
Admission: AD | Disposition: A | Discharge status: Home / Self Care | Payer: Self-pay | Source: Ambulatory Visit | Attending: Family Medicine

## 2015-01-21 ENCOUNTER — Encounter (HOSPITAL_COMMUNITY): Payer: Self-pay | Admitting: *Deleted

## 2015-01-21 DIAGNOSIS — O001 Tubal pregnancy: Secondary | ICD-10-CM | POA: Insufficient documentation

## 2015-01-21 DIAGNOSIS — J45909 Unspecified asthma, uncomplicated: Secondary | ICD-10-CM | POA: Insufficient documentation

## 2015-01-21 DIAGNOSIS — O009 Unspecified ectopic pregnancy without intrauterine pregnancy: Secondary | ICD-10-CM | POA: Diagnosis present

## 2015-01-21 DIAGNOSIS — K661 Hemoperitoneum: Secondary | ICD-10-CM

## 2015-01-21 HISTORY — DX: Unspecified asthma, uncomplicated: J45.909

## 2015-01-21 HISTORY — PX: LAPAROSCOPY: SHX197

## 2015-01-21 LAB — TYPE AND SCREEN
ABO/RH(D): O POS
Antibody Screen: NEGATIVE

## 2015-01-21 LAB — CBC
HEMATOCRIT: 36.3 % (ref 36.0–46.0)
HEMOGLOBIN: 12.4 g/dL (ref 12.0–15.0)
MCH: 30.2 pg (ref 26.0–34.0)
MCHC: 34.2 g/dL (ref 30.0–36.0)
MCV: 88.5 fL (ref 78.0–100.0)
Platelets: 292 10*3/uL (ref 150–400)
RBC: 4.1 MIL/uL (ref 3.87–5.11)
RDW: 12.7 % (ref 11.5–15.5)
WBC: 8.8 10*3/uL (ref 4.0–10.5)

## 2015-01-21 LAB — HCG, QUANTITATIVE, PREGNANCY: hCG, Beta Chain, Quant, S: 2948 m[IU]/mL — ABNORMAL HIGH (ref <5)

## 2015-01-21 SURGERY — LAPAROSCOPY OPERATIVE
Anesthesia: General

## 2015-01-21 MED ORDER — LIDOCAINE HCL (CARDIAC) 20 MG/ML IV SOLN
INTRAVENOUS | Status: DC | PRN
  Administered 2015-01-21: 60 mg via INTRAVENOUS

## 2015-01-21 MED ORDER — ONDANSETRON HCL 4 MG/2ML IJ SOLN
INTRAMUSCULAR | Status: DC | PRN
  Administered 2015-01-21: 4 mg via INTRAVENOUS

## 2015-01-21 MED ORDER — GLYCOPYRROLATE 0.2 MG/ML IJ SOLN
INTRAMUSCULAR | Status: DC | PRN
  Administered 2015-01-21: .4 mg via INTRAVENOUS
  Administered 2015-01-21: 0.1 mg via INTRAVENOUS

## 2015-01-21 MED ORDER — ROCURONIUM BROMIDE 100 MG/10ML IV SOLN
INTRAVENOUS | Status: DC | PRN
  Administered 2015-01-21: 30 mg via INTRAVENOUS
  Administered 2015-01-21: 5 mg via INTRAVENOUS

## 2015-01-21 MED ORDER — ONDANSETRON HCL 4 MG/2ML IJ SOLN
INTRAMUSCULAR | Status: AC
  Filled 2015-01-21: qty 2

## 2015-01-21 MED ORDER — SUCCINYLCHOLINE CHLORIDE 20 MG/ML IJ SOLN
INTRAMUSCULAR | Status: DC | PRN
  Administered 2015-01-21: 100 mg via INTRAVENOUS

## 2015-01-21 MED ORDER — LACTATED RINGERS IR SOLN
Status: DC | PRN
  Administered 2015-01-21: 3000 mL

## 2015-01-21 MED ORDER — HYDROMORPHONE HCL 1 MG/ML IJ SOLN
INTRAMUSCULAR | Status: AC
  Filled 2015-01-21: qty 1

## 2015-01-21 MED ORDER — DEXAMETHASONE SODIUM PHOSPHATE 4 MG/ML IJ SOLN
INTRAMUSCULAR | Status: DC | PRN
  Administered 2015-01-21: 4 mg via INTRAVENOUS

## 2015-01-21 MED ORDER — GLYCOPYRROLATE 0.2 MG/ML IJ SOLN
INTRAMUSCULAR | Status: AC
  Filled 2015-01-21: qty 2

## 2015-01-21 MED ORDER — PHENYLEPHRINE HCL 10 MG/ML IJ SOLN
INTRAMUSCULAR | Status: DC | PRN
  Administered 2015-01-21: 40 ug via INTRAVENOUS

## 2015-01-21 MED ORDER — LIDOCAINE HCL (CARDIAC) 20 MG/ML IV SOLN
INTRAVENOUS | Status: AC
  Filled 2015-01-21: qty 5

## 2015-01-21 MED ORDER — OXYCODONE-ACETAMINOPHEN 5-325 MG PO TABS
ORAL_TABLET | ORAL | Status: AC
  Filled 2015-01-21: qty 1

## 2015-01-21 MED ORDER — BUPIVACAINE HCL (PF) 0.25 % IJ SOLN
INTRAMUSCULAR | Status: DC | PRN
Start: 2015-01-21 — End: 2015-01-21
  Administered 2015-01-21: 10 mL

## 2015-01-21 MED ORDER — LACTATED RINGERS IV BOLUS (SEPSIS)
1000.0000 mL | Freq: Once | INTRAVENOUS | Status: AC
  Administered 2015-01-21: 500 mL via INTRAVENOUS
  Administered 2015-01-21: 1000 mL via INTRAVENOUS
  Administered 2015-01-21: 500 mL via INTRAVENOUS

## 2015-01-21 MED ORDER — PROPOFOL 10 MG/ML IV BOLUS
INTRAVENOUS | Status: DC | PRN
  Administered 2015-01-21: 250 mg via INTRAVENOUS

## 2015-01-21 MED ORDER — CITRIC ACID-SODIUM CITRATE 334-500 MG/5ML PO SOLN
30.0000 mL | Freq: Once | ORAL | Status: AC
  Administered 2015-01-21: 30 mL via ORAL
  Filled 2015-01-21: qty 15

## 2015-01-21 MED ORDER — MIDAZOLAM HCL 2 MG/2ML IJ SOLN
INTRAMUSCULAR | Status: DC | PRN
  Administered 2015-01-21: 2 mg via INTRAVENOUS

## 2015-01-21 MED ORDER — NEOSTIGMINE METHYLSULFATE 10 MG/10ML IV SOLN
INTRAVENOUS | Status: AC
  Filled 2015-01-21: qty 1

## 2015-01-21 MED ORDER — NEOSTIGMINE METHYLSULFATE 10 MG/10ML IV SOLN
INTRAVENOUS | Status: DC | PRN
  Administered 2015-01-21: 3 mg via INTRAVENOUS

## 2015-01-21 MED ORDER — ACETAMINOPHEN 500 MG PO TABS
1000.0000 mg | ORAL_TABLET | Freq: Once | ORAL | Status: AC
  Administered 2015-01-21: 1000 mg via ORAL
  Filled 2015-01-21: qty 2

## 2015-01-21 MED ORDER — FENTANYL CITRATE (PF) 100 MCG/2ML IJ SOLN
25.0000 ug | INTRAMUSCULAR | Status: DC | PRN
  Administered 2015-01-21: 25 ug via INTRAVENOUS

## 2015-01-21 MED ORDER — HYDROCODONE-ACETAMINOPHEN 7.5-325 MG PO TABS: 1.0000 | ORAL_TABLET | Freq: Once | ORAL | Status: DC | PRN

## 2015-01-21 MED ORDER — MIDAZOLAM HCL 2 MG/2ML IJ SOLN
INTRAMUSCULAR | Status: AC
  Filled 2015-01-21: qty 4

## 2015-01-21 MED ORDER — FENTANYL CITRATE (PF) 250 MCG/5ML IJ SOLN
INTRAMUSCULAR | Status: DC | PRN
  Administered 2015-01-21: 50 ug via INTRAVENOUS
  Administered 2015-01-21 (×2): 100 ug via INTRAVENOUS

## 2015-01-21 MED ORDER — ROCURONIUM BROMIDE 100 MG/10ML IV SOLN
INTRAVENOUS | Status: AC
  Filled 2015-01-21: qty 1

## 2015-01-21 MED ORDER — BUPIVACAINE HCL (PF) 0.25 % IJ SOLN
INTRAMUSCULAR | Status: AC
  Filled 2015-01-21: qty 30

## 2015-01-21 MED ORDER — HYDROMORPHONE HCL 1 MG/ML IJ SOLN
INTRAMUSCULAR | Status: DC | PRN
  Administered 2015-01-21: 1 mg via INTRAVENOUS

## 2015-01-21 MED ORDER — OXYCODONE-ACETAMINOPHEN 5-325 MG PO TABS
1.0000 | ORAL_TABLET | ORAL | Status: DC | PRN
  Administered 2015-01-21: 1 via ORAL

## 2015-01-21 MED ORDER — FENTANYL CITRATE (PF) 250 MCG/5ML IJ SOLN
INTRAMUSCULAR | Status: AC
  Filled 2015-01-21: qty 25

## 2015-01-21 MED ORDER — OXYCODONE-ACETAMINOPHEN 5-325 MG PO TABS: 1.0000 | ORAL_TABLET | Freq: Four times a day (QID) | ORAL | Status: AC | PRN

## 2015-01-21 MED ORDER — PROPOFOL 10 MG/ML IV BOLUS
INTRAVENOUS | Status: AC
  Filled 2015-01-21: qty 20

## 2015-01-21 MED ORDER — LACTATED RINGERS IV SOLN
INTRAVENOUS | Status: DC | PRN
  Administered 2015-01-21: 16:00:00 via INTRAVENOUS

## 2015-01-21 MED ORDER — METOCLOPRAMIDE HCL 5 MG/ML IJ SOLN: 10.0000 mg | Freq: Once | INTRAMUSCULAR | Status: DC | PRN

## 2015-01-21 MED ORDER — FENTANYL CITRATE (PF) 100 MCG/2ML IJ SOLN
INTRAMUSCULAR | Status: AC
  Filled 2015-01-21: qty 2

## 2015-01-21 MED ORDER — MEPERIDINE HCL 25 MG/ML IJ SOLN: 6.2500 mg | INTRAMUSCULAR | Status: DC | PRN

## 2015-01-21 MED ORDER — DEXAMETHASONE SODIUM PHOSPHATE 10 MG/ML IJ SOLN
INTRAMUSCULAR | Status: AC
  Filled 2015-01-21: qty 1

## 2015-01-21 MED ORDER — FAMOTIDINE IN NACL 20-0.9 MG/50ML-% IV SOLN
20.0000 mg | Freq: Once | INTRAVENOUS | Status: AC
  Administered 2015-01-21: 20 mg via INTRAVENOUS
  Filled 2015-01-21: qty 50

## 2015-01-21 SURGICAL SUPPLY — 30 items
CABLE HIGH FREQUENCY MONO STRZ (ELECTRODE) IMPLANT
CATH ROBINSON RED A/P 16FR (CATHETERS) IMPLANT
CHLORAPREP W/TINT 26ML (MISCELLANEOUS) ×3 IMPLANT
CLOTH BEACON ORANGE TIMEOUT ST (SAFETY) ×3 IMPLANT
DRSG COVADERM PLUS 2X2 (GAUZE/BANDAGES/DRESSINGS) ×6 IMPLANT
DRSG OPSITE POSTOP 3X4 (GAUZE/BANDAGES/DRESSINGS) ×3 IMPLANT
FORCEPS CUTTING 33CM 5MM (CUTTING FORCEPS) IMPLANT
FORCEPS CUTTING 45CM 5MM (CUTTING FORCEPS) IMPLANT
GLOVE BIOGEL PI IND STRL 7.0 (GLOVE) ×1 IMPLANT
GLOVE BIOGEL PI INDICATOR 7.0 (GLOVE) ×2
GLOVE ECLIPSE 7.0 STRL STRAW (GLOVE) ×6 IMPLANT
GOWN STRL REUS W/TWL LRG LVL3 (GOWN DISPOSABLE) ×9 IMPLANT
LIQUID BAND (GAUZE/BANDAGES/DRESSINGS) ×3 IMPLANT
NS IRRIG 1000ML POUR BTL (IV SOLUTION) ×3 IMPLANT
PACK LAPAROSCOPY BASIN (CUSTOM PROCEDURE TRAY) ×3 IMPLANT
PAD POSITIONING PINK XL (MISCELLANEOUS) ×3 IMPLANT
POUCH SPECIMEN RETRIEVAL 10MM (ENDOMECHANICALS) ×3 IMPLANT
SET IRRIG TUBING LAPAROSCOPIC (IRRIGATION / IRRIGATOR) IMPLANT
SURGIFLO W/THROMBIN 8M KIT (HEMOSTASIS) ×3 IMPLANT
SUT VIC AB 3-0 X1 27 (SUTURE) ×3 IMPLANT
SUT VICRYL 0 UR6 27IN ABS (SUTURE) ×6 IMPLANT
SUT VICRYL 4-0 PS2 18IN ABS (SUTURE) ×3 IMPLANT
TOWEL OR 17X24 6PK STRL BLUE (TOWEL DISPOSABLE) ×6 IMPLANT
TRAY FOLEY CATH SILVER 14FR (SET/KITS/TRAYS/PACK) ×3 IMPLANT
TROCAR BALLN 12MMX100 BLUNT (TROCAR) IMPLANT
TROCAR OPTI TIP 5M 100M (ENDOMECHANICALS) ×6 IMPLANT
TROCAR XCEL DIL TIP R 11M (ENDOMECHANICALS) IMPLANT
TROCAR XCEL NON-BLD 11X100MML (ENDOMECHANICALS) ×3 IMPLANT
WARMER LAPAROSCOPE (MISCELLANEOUS) ×3 IMPLANT
WATER STERILE IRR 1000ML POUR (IV SOLUTION) ×3 IMPLANT

## 2015-01-21 NOTE — Discharge Instructions (Signed)
Tratamiento con metotrexato para Audiological scientist, cuidados posteriores (Methotrexate Treatment for an Ectopic Pregnancy, Care After) Siga estas indicaciones durante las prximas semanas. Estas indicaciones le proporcionan informacin general acerca de cmo deber cuidarse despus del procedimiento. El mdico tambin podr darle indicaciones ms especficas. El tratamiento se ha planificado de acuerdo a las prcticas mdicas actuales, pero a veces pueden ocurrir problemas. Comunquese con el mdico si tiene algn problema o tiene dudas despus del procedimiento. QU ESPERAR DESPUS DEL PROCEDIMIENTO Puede tener algunos clicos abdominales, hemorragia vaginal y fatiga durante los primeros das despus de recibir Air cabin crew. Otros efectos secundarios posibles del metotrexato incluyen:  Nuseas.  Vmitos.  Diarrea.  Llagas en la boca.  Hinchazn o irritacin del revestimiento pulmonar (neumonitis).  Dao heptico.  Prdida del cabello. INSTRUCCIONES PARA EL CUIDADO EN EL HOGAR  Luego de recibir [employer], debe tener cuidado con las actividades que [place], y Chief Operating Officer su afeccin durante algunas semanas. Puede pasar 1semana antes de que los niveles hormonales se normalicen.  Cumpla con todas las visitas de control, segn le indique su mdico.  Evite viajar a sitios alejados de donde est su mdico.  No tenga relaciones sexuales hasta que el mdico le diga que es seguro.  Puede retomar su dieta habitual.  Limite las actividades extenuantes.  No tome cido flico, vitaminas prenatales ni otras vitaminas que contengan cido flico.  No tome aspirina, ibuprofeno ni naproxeno (antiinflamatorios no esteroides [AINE]).  No beba alcohol. SOLICITE ATENCIN MDICA SI:   No puede controlar las nuseas y los vmitos.  No puede controlar la diarrea.  Tiene llagas en la boca y [place].  Necesita analgsicos ms fuertes para su dolor.  Tiene una erupcin  cutnea.  Tiene una reaccin adversa a los medicamentos. SOLICITE ATENCIN MDICA DE INMEDIATO SI:   Tiene cada vez ms dolor abdominal o plvico.  Observa un aumento de la hemorragia.  Se siente mareada o se desmaya.  Le falta el aire.  Aumenta su frecuencia cardaca.  Tiene tos.  Tiene escalofros.  Tiene fiebre. Document Released: 03/29/2011 Document Revised: 04/14/2013 [facility] Patient Information 2015 [place], [place]. This information is not intended to replace advice given to you by your health care provider. Make sure you discuss any questions you have with your health care provider.  Embarazo ectpico ( Ectopic Pregnancy) Un embarazo ectpico ocurre cuando el vulo fertilizado se fija (implanta) fuera del tero. La mayora de los embarazos ectpicos se producen en la trompa de [place]. Es raro que ocurran en el ovario, el intestino, la pelvis o el cuello uterino. En un embarazo ectpico, el vulo fertilizado no tiene la capacidad de llegar a ser un beb normal y sano.  Neomia Dear ruptura en el embarazo ectpico se produce cuando la trompa de Falopio se desgarra o se rompe y Futures trader como resultado una hemorragia interna. A menudo hay un intenso dolor abdominal y en algunos casos sangrado vaginal. Tener un embarazo ectpico puede ser Neomia Dear experiencia que pone en peligro la vida. Si no se trata, esta grave afeccin puede requerir una transfusin de [place], ciruga abdominal, o incluso causar la muerte. CAUSAS  En la [employer] de los casos se sospecha un dao en las trompas de Falopio como causa del Multimedia programmer.  FACTORES DE RIESGO Dependiendo de sus circunstancias, el nivel de riesgo de tener un embarazo ectpico variar. [place] de riesgo puede dividirse en tres categoras. Alto Riesgo  Usted ha realizado tratamientos para la infertilidad.  Ha tenido un embarazo ectpico previo.  Fue sometida a [place] de trompas.  Tuvo una ciruga previa para ligar las trompas de Falopio  (ligadura de trompas).  Tiene problemas o enfermedades en las trompas.  Ha estado expuesta al DES. El DES es un medicamento que se [place] [employer] 1971 y [employer] en los bebs cuyas madres lo tomaron.  Queda embarazada mientras [place] un dispositivo intrauterino (DIU) como mtodo anticonceptivo. Riesgo moderado  Tiene antecedentes de infertilidad.  Ha sufrido alguna enfermedad de transmisin sexual (ETS).  Tiene antecedentes de enfermedad plvica inflamatoria (EPI).  Tiene cicatrices por endometriosis.  Tiene mltiples parejas sexuales.  Fuma. Bajo riesgo  Fue sometida a una ciruga plvica.  [place] duchas vaginales.  Comenz a ser [employer] de los 18 aos de [place]. SIGNOS Y SNTOMAS  El embarazo ectpico se debe sospechar en cualquier mujer a la que le ha faltado un perodo y tiene dolor abdominal o sangrado.  Puede experimentar sntomas normales de Psychiatrist, tales como:  Nuseas.  Cansancio.  Inflamacin mamaria.  Otros sntomas son:  Futures trader.  Hemorragia vaginal o manchado irregular.  Clicos o dolor en uno de los lados o en la zona inferior del abdomen.  Latidos cardacos acelerados.  Desmayarse al defecar.  Los sntomas de un embarazo ectpico con ruptura y hemorragias internas son:  Dolor intenso y sbito en el abdomen y la pelvis.  Mareos o [employer].  Dolor en la zona del hombro. DIAGNSTICO  Los exmenes que se indicarn son:  Test de [place].  Una ecografa.  Prueba de nivel especfico de hormona del embarazo en el torrente sanguneo.  Extraccin de [place] de tejido del tero (dilatacin y curetaje, D y C).  Ciruga para Charity fundraiser visual del interior del abdomen usando un tubo delgado, que emite luz con una pequea cmara en el extremo (laparoscopio). TRATAMIENTO  Se podr aplicar una inyeccin de un medicamento denominado metotrexato. Este medicamento hace que se absorba el tejido  del [place]. Se administra en los siguientes casos:  Hay un diagnstico temprano.  La trompa de Falopio no se ha roto.  Se la considera una buena candidata para recibir Research scientist (medical). Por lo general, despus del tratamiento con metotrexato se comprueban los niveles de hormonas del [place]. Esto se hace para asegurarse de que el medicamento es [place]. Puede llevar entre 4 y 6 semanas para que el Psychiatrist sea absorbido (aunque la mayora de los embarazos se Environmental health practitioner a Agricultural engineer). Puede ser necesario realizar un tratamiento quirrgico. Se puede utilizar un laparoscopio para retirar el tejido del Psychiatrist. Si hay una hemorragia interna grave, se hace un corte (incisin) en la zona inferior del abdomen (laparotoma) y se extirpa el embarazo ectpico. De este modo de detendr el sangrado. Es posible que tambin se extirpe parte de la trompa de Falopio o la trompa entera (salpingectoma). Luego de la [place], le harn un anlisis de hormona del embarazo para asegurarse de que no quedan tejidos. Quizs reciba la vacuna de inmunoglobulina Rho(D) si usted es Rh negativa y el padre es Rh positivo, o si no conoce el tipo de Rh del padre. Esto evita problemas en prximos embarazos. SOLICITE ATENCIN MDICA DE INMEDIATO SI:  Tiene sntomas de embarazo ectpico. Esto es una emergencia mdica. ASEGRESE DE QUE:  Comprende estas instrucciones.  Controlar su afeccin.  Recibir ayuda de inmediato si no mejora o si empeora. Document Released: 04/09/2005 Document Revised: 04/14/2013 [facility] Patient Information 2015 [place], [place]. This information is not intended to replace advice given  to you by your health care provider. Make sure you discuss any questions you have with your health care provider.  Ruptura de Chartered loss adjuster ectpico (Ruptured Ectopic Pregnancy) Un embarazo ectpico ocurre cuando el vulo fertilizado se fija (implanta) fuera del tero. La mayora de los embarazos ectpicos se producen en la  trompa de [place]. Es raro que ocurran en el ovario, el intestino, la pelvis o el cuello uterino. Un embarazo ectpico no tiene la capacidad de llegar a ser un beb normal y sano.  Neomia Dear ruptura en el embarazo ectpico se produce cuando la trompa de Falopio se desgarra o se rompe y Futures trader como resultado una hemorragia interna. A menudo hay un intenso dolor abdominal y en algunos casos sangrado vaginal. Un embarazo ectpico puede ser potencialmente mortal. Si no se trata, esta grave afeccin puede requerir una transfusin de [place], ciruga abdominal, o incluso causar la muerte.  CAUSAS  En la [employer] de los casos se sospecha un dao en las trompas de Falopio como causa del Multimedia programmer.  FACTORES DE RIESGO Dependiendo de sus circunstancias, el nivel de riesgo de tener un embarazo ectpico variar. Hay 3 categoras que pueden ayudar a identificar si est potencialmente en riesgo. Alto Riesgo  Usted ha realizado tratamientos para la infertilidad.  Ha tenido un embarazo ectpico previo.  Fue sometida a [place] de trompas.  Tuvo una ciruga previa para ligar las trompas de Falopio (ligadura de trompas).  Tiene problemas o enfermedades en las trompas.  Ha estado expuesta al DES. El DES es un medicamento que se [place] [employer] 1971 y [employer] en los bebs cuyas madres lo tomaron.  Queda embarazada mientras [place] un dispositivo intrauterino (DIU) como mtodo anticonceptivo. Riesgo moderado  Tiene antecedentes de infertilidad.  Ha sufrido alguna enfermedad de transmisin sexual (ETS).  Tiene antecedentes de enfermedad plvica inflamatoria (EPI).  Tiene cicatrices por endometriosis.  Tiene mltiples parejas sexuales.  Fuma. Bajo riesgo  Fue sometida a una ciruga plvica.  [place] duchas vaginales.  Comenz a ser [employer] de los 18 aos de [place]. SNTOMAS El Psychiatrist ectpico se debe sospechar en cualquier mujer a la que le ha faltado un perodo y tiene dolor abdominal  o sangrado.  Puede experimentar sntomas normales de Psychiatrist, tales como:  Nuseas.  Cansancio.  Inflamacin mamaria.  Los sntomas que no son normales son:  Futures trader.  Hemorragia vaginal o manchado irregular.  Clicos o dolor en uno de los lados o en la zona inferior del abdomen.  Latidos cardacos acelerados.  Desmayarse al defecar.  Los sntomas de un embarazo ectpico con ruptura y hemorragias internas son:  Dolor intenso y sbito en el abdomen y la pelvis.  Mareos o [employer].  Dolor en la zona del hombro. DIAGNSTICO  Los exmenes que se indicarn son:  Test de [place].  Ecografa.  Prueba de nivel especfico de hormona del embarazo en el torrente sanguneo.  Extraccin de [place] de tejido del tero (dilatacin y curetaje, D y C).  Ciruga para Charity fundraiser visual del interior del abdomen mediante un tubo que emite luz (laparoscopa). TRATAMIENTO  La ciruga laparoscpica o ciruga abdominal se recomienda para un embarazo ectpico con ruptura.   Podra ser necesario extirpar la trompa de Falopio completa (salpingectoma).  Si la trompa no est muy daada, podrn salvarla y extirpar [place]. Con el tiempo, la trompa podr seguir funcionando.  Si ha perdido [employer], Pension scheme manager una transfusin.  Quizs reciba la vacuna  de inmunoglobulina Rho(D) si usted es Rh-negativo y el padre es Rh-positivo, o si no conoce el tipo de Rh del padre. Esto evita problemas en prximos embarazos. SOLICITE ATENCIN MDICA DE INMEDIATO SI:  Tiene sntomas de embarazo ectpico o de ruptura de un embarazo ectpico. Esto es una emergencia mdica. ASEGRESE DE QUE:  Comprende estas instrucciones.  Controlar su afeccin.  Recibir ayuda de inmediato si no mejora o si empeora. Document Released: 01/17/2005 Document Revised: 04/14/2013 [facility] Patient Information 2015 [place], [place]. This information is not  intended to replace advice given to you by your health care provider. Make sure you discuss any questions you have with your health care provider.

## 2015-01-21 NOTE — Op Note (Signed)
Jolly Mango Tomasa Hose  PROCEDURE DATE: 01/21/2015  PREOPERATIVE DIAGNOSIS: Ruptured ectopic pregnancy  POSTOPERATIVE DIAGNOSIS: Ruptured left fallopian tube ectopic pregnancy  PROCEDURE: Laparoscopic left salpingectomy, right salpingostomy  SURGEON:  Dr. Tinnie Gens  ASSISTANT: None  ANESTHESIOLOGIST: Mal Amabile, MD  INDICATIONS: 29 y.o. G2P0010 at [redacted]w[redacted]d here for with ruptured ectopic pregnancy with blood type O pos. Patient was counseled regarding need for laparoscopic salpingectomy. Risks of surgery including bleeding which may require transfusion or reoperation, infection, injury to bowel or other surrounding organs, need for additional procedures including laparotomy and other postoperative/anesthesia complications were explained to patient.  Written informed consent was obtained.  FINDINGS:  Moderate amount of hemoperitoneum estimated to be about 150 cc of blood and clots.  Dilated left fallopian tube containing ectopic gestation, dilated the entire length, with blood coming from the fimbriated end. Right tube with small dilation at midportion of the tube. Small normal appearing uterus, right ovary and left ovary. Normal appendix.  ANESTHESIA: General  SPECIMENS: left fallopian tube to pathology, right ectopic tissue  COMPLICATIONS: None immediate  PROCEDURE IN DETAIL:  The patient was taken to the operating room where general anesthesia was administered and was found to be adequate.  She was placed in the dorsal lithotomy position, and was prepped and draped in a sterile manner.  A Foley catheter was inserted into her bladder and attached to constant drainage and a uterine manipulator was then advanced into the uterus .  After an adequate timeout was performed, attention was then turned to the patient's abdomen where a 10-mm skin incision was made on the umbilical fold. Fascia and peritoneum were entered sharply. One purse string 0 Vicryl suture was used to hold the fascia.  A  Hassan trocar was placed. The laparoscope was introduced.  A survey of the patient's pelvis and abdomen revealed the findings as above.  Two left lower quadrant ports were placed under direct visualization; 5-mm. The pelvis was inspected. 2 abnormal tubes were found. There was blood i the cul-de-sac. Pictures were taken. The left tube inspected which was most abnormal, blood coming from end. Decision made to take this tube.  Attention was then turned to the left fallopian tube which was grasped and ligated from the underlying mesosalpinx and uterine attachment using the Harmonic scalpel instrument.  Good hemostasis was noted. The specimen was placed in an EndoCatch bag and removed from the abdomen intact. The right tube was inspected and there was a notable bulge in the middle of the tube.  The Harmonic was used to create a hole which revealed blood clot and tissue. This was taken out of the tube. The [place] used to remove blood.  Slight bleeding from right tube noted. Surgi-flow placed on this. The abdomen was desufflated, and all instruments were removed.  The umbilicus incision was closed with the afore mentioned Vicryl sutures in two  figure-of-eight stiches; and all skin incisions were closed with a 3-0 Vicryl subcuticular stitch. The patient tolerated the procedures well.  All instruments, needles, and sponge counts were correct x 2. The patient was taken to the recovery room in stable condition.   PRATT,TANYA S MD 01/21/2015 5:34 PM

## 2015-01-21 NOTE — H&P (Addendum)
Rebekah Stone is an 29 y.o. G1P0000 [redacted]w[redacted]d female.   Chief Complaint: Abdominal pain  HPI: Patient is 6 days s/p MTX treatment, with appropriately falling quants, who mentioned worsening pain over the last 24 hours. TVUS reveals large amount of clots and debris in pelvis consistent with rupture. Pain is lower abdomen, right sided, worse with movement, no improvement with OTC pain relievers.  Past Medical History  Diagnosis Date  . Ectopic pregnancy     Treated w/ MTX 12/2014  . Asthma     Past Surgical History  Procedure Laterality Date  . No past surgeries      History reviewed. No pertinent family history. Social History:  reports that she has never smoked. She does not have any smokeless tobacco history on file. She reports that she does not drink alcohol or use illicit drugs.  Allergies: No Known Allergies  Meds: No current facility-administered medications on file prior to encounter.   No current outpatient prescriptions on file prior to encounter.    A comprehensive review of systems was negative except for: Gastrointestinal: positive for abdominal pain  Blood pressure 105/60, pulse 83, temperature 98.7 F (37.1 C), temperature source Oral, resp. rate 18, last menstrual period 12/06/2014, SpO2 100 %, unknown if currently breastfeeding. General appearance: alert, cooperative and appears stated age Head: Normocephalic, without obvious abnormality, atraumatic Neck: supple, symmetrical, trachea midline Lungs: normal effort Heart: regular rate and rhythm Abdomen: lower abdominal tenderness Extremities: Homans sign is negative, no sign of DVT Skin: Skin color, texture, turgor normal. No rashes or lesions Neurologic: Grossly normal  Lab Results  Component Value Date   HCGBETAQNT 2948* 01/21/2015   HCGBETAQNT 3917* 01/18/2015   HCGBETAQNT 3447* 01/15/2015     Lab Results  Component Value Date   WBC 8.8 01/21/2015   HGB 12.4 01/21/2015   HCT 36.3 01/21/2015    MCV 88.5 01/21/2015   PLT 292 01/21/2015   US Ob Comp Less 14 Wks  01/11/2015   CLINICAL DATA:  29 year old female with positive pregnancy test presenting with vaginal bleeding and increasing pelvic pain.  EXAM: OBSTETRIC <14 WK [place] AND TRANSVAGINAL OB US  TECHNIQUE: Both transabdominal and transvaginal ultrasound examinations were performed for complete evaluation of the gestation as well as the maternal uterus, adnexal regions, and pelvic cul-de-sac. Transvaginal technique was performed to assess early pregnancy.  COMPARISON:  None.  FINDINGS: The uterus is anteverted and demonstrates a heterogeneous echotexture. No intrauterine pregnancy identified. In the setting of a positive HCG level and absent intrauterine pregnancy and ectopic pregnancy is not entirely excluded. Correlation with clinical exam and follow-up with serial HCG levels and ultrasound recommended.  A 3.0 x 2.8 x 3.3 cm solid lesion noted in the right uterus most compatible with a fibroid. There is a 1.9 x 1.1 x 1.6 cm heterogeneous and hypoechoic lesion to the left of external cervical os. This lesion demonstrates cystic component with areas of solid tissue or septation. Doppler images demonstrate flow to the solid component/ septation of this lesion. Correlation with gynecological exam recommended. Further evaluation with MRI is advised for further characterization.  The right ovary measures 4.4 x 2.5 x 2.9 cm and the left ovary measures 4.2 x 2.5 x 0.9 cm. There are bilateral complex/hypoechoic ovarian lesions most likely corpus luteum or hemorrhagic cysts. Ectopic pregnancy less likely. Correlation with clinical exam and location of pain recommended.  Small free fluid within the pelvis  IMPRESSION: No intrauterine pregnancy identified. Ectopic pregnancy not entirely excluded. Correlation with  clinical exam and follow-up with serial HCG levels and ultrasound recommended.  Follow-up bilateral corpus luteum and hemorrhagic. Ectopic pregnancy  is less likely. Correlation with clinical exam and location of pain recommended.  Complex predominantly hypoechoic lesion to the left of the external os. Correlation with gynecological exam and MRI recommended.  Right ovarian fibroid.   Electronically Signed   By: Elgie Collard M.D.   On: 01/11/2015 22:49   US Ob Transvaginal  01/21/2015   CLINICAL DATA:  Known ectopic pregnancy.  EXAM: TRANSVAGINAL OB ULTRASOUND  TECHNIQUE: Transvaginal ultrasound was performed for complete evaluation of the gestation as well as the maternal uterus, adnexal regions, and pelvic cul-de-sac.  COMPARISON:  01/15/2015  FINDINGS: Intrauterine gestational sac: None visualized  Yolk sac:  N/A  Embryo:  N/A  Maternal uterus/adnexae: Large amount of blood and clots noted within the pelvis suggesting ruptured ectopic pregnancy. The ectopic pregnancy is again noted measuring 1.6 x 1.5 x 1.2 cm.  IMPRESSION: No intrauterine gestation.  Complex free fluid in the pelvis compatible with blood. Blood clots noted. Findings most suspicious of ruptured right ectopic pregnancy.  Critical Value/emergent results were called by telephone at the time of interpretation on 01/21/2015 at 2:05 pm to LISA LEFTWICH-KIRBY , who verbally acknowledged these results.   Electronically Signed   By: Charlett Nose M.D.   On: 01/21/2015 14:07   US Ob Transvaginal  01/15/2015   CLINICAL DATA:  Pregnant, left lower quadrant pain  EXAM: TRANSVAGINAL OB ULTRASOUND  TECHNIQUE: Transvaginal ultrasound was performed for complete evaluation of the gestation as well as the maternal uterus, adnexal regions, and pelvic cul-de-sac.  COMPARISON:  01/11/2015  FINDINGS: Intrauterine gestational sac: Not visualized  Maternal uterus/adnexae: Left ovary is poorly visualized but within normal limits.  Right ovary is within normal limits.  Adjacent 2.1 x 1.3 x 1.3 cm complex right adnexal mass, superolateral to the right ovary (image 45), with associated peripheral color Doppler  flow.  Trace right pelvic ascites.  IMPRESSION: No IUP is visualized.  2.1 cm complex right adnexal mass, highly suspicious for ectopic pregnancy. No findings to suggest rupture.  These results were called by telephone at the time of interpretation on 01/15/2015 at 2:35 pm to Judeth Horn, NP, who verbally acknowledged these results.   Electronically Signed   By: Charline Bills M.D.   On: 01/15/2015 14:35   US Ob Transvaginal  01/11/2015   CLINICAL DATA:  29 year old female with positive pregnancy test presenting with vaginal bleeding and increasing pelvic pain.  EXAM: OBSTETRIC <14 WK [place] AND TRANSVAGINAL OB US  TECHNIQUE: Both transabdominal and transvaginal ultrasound examinations were performed for complete evaluation of the gestation as well as the maternal uterus, adnexal regions, and pelvic cul-de-sac. Transvaginal technique was performed to assess early pregnancy.  COMPARISON:  None.  FINDINGS: The uterus is anteverted and demonstrates a heterogeneous echotexture. No intrauterine pregnancy identified. In the setting of a positive HCG level and absent intrauterine pregnancy and ectopic pregnancy is not entirely excluded. Correlation with clinical exam and follow-up with serial HCG levels and ultrasound recommended.  A 3.0 x 2.8 x 3.3 cm solid lesion noted in the right uterus most compatible with a fibroid. There is a 1.9 x 1.1 x 1.6 cm heterogeneous and hypoechoic lesion to the left of external cervical os. This lesion demonstrates cystic component with areas of solid tissue or septation. Doppler images demonstrate flow to the solid component/ septation of this lesion. Correlation with gynecological exam recommended. Further evaluation with MRI  is advised for further characterization.  The right ovary measures 4.4 x 2.5 x 2.9 cm and the left ovary measures 4.2 x 2.5 x 0.9 cm. There are bilateral complex/hypoechoic ovarian lesions most likely corpus luteum or hemorrhagic cysts. Ectopic pregnancy less  likely. Correlation with clinical exam and location of pain recommended.  Small free fluid within the pelvis  IMPRESSION: No intrauterine pregnancy identified. Ectopic pregnancy not entirely excluded. Correlation with clinical exam and follow-up with serial HCG levels and ultrasound recommended.  Follow-up bilateral corpus luteum and hemorrhagic. Ectopic pregnancy is less likely. Correlation with clinical exam and location of pain recommended.  Complex predominantly hypoechoic lesion to the left of the external os. Correlation with gynecological exam and MRI recommended.  Right ovarian fibroid.   Electronically Signed   By: Elgie Collard M.D.   On: 01/11/2015 22:49     Assessment/Plan Patient Active Problem List   Diagnosis Date Noted  . Ectopic pregnancy without intrauterine pregnancy 01/15/2015   For laparoscopic removal. Risks include but are not limited to bleeding, infection, injury to surrounding structures, including bowel, bladder and ureters, blood clots, and death.  Likelihood of success is high.   PRATT,TANYA S 01/21/2015, 3:01 PM

## 2015-01-21 NOTE — MAU Provider Note (Deleted)
S: 29 y.o. G1P0010 presents to MAU for repeat hcg.  She is on Day 7 following MTX therapy for ectopic pregnancy.  She denies abdominal pain or vaginal bleeding today.    Her quant hcg on 9/24 was 3447, the day MTX was given and ultrasound showed no IUP and 2 cm right adnexal mass suspicious for ectopic pregnancy.  On 9/27, quant hcg was 3917.      O: BP 102/60 mmHg  Pulse 81  Temp(Src) 98.3 F (36.8 C) (Oral)  Resp 16  LMP 12/06/2014  Physical Examination: General appearance - alert, well appearing, and in no distress, oriented to person, place, and time and acyanotic, in no respiratory distress  Results for orders placed or performed during the hospital encounter of 01/21/15 (from the past 24 hour(s))  hCG, quantitative, pregnancy     Status: Abnormal   Collection Time: 01/21/15  9:39 AM  Result Value Ref Range   hCG, Beta Chain, Quant, S 2948 (H) <5 mIU/mL    --/--/O POS (09/20 1823)   MDM: Ordered and reviewed labs.  Quant hcg result today indicates >20% drop in hcg on Day 7 of MTX therapy, indicating effectiveness of medication and beginning resolution of pregnancy.  Pt to f/u outpatient for continuing management and labwork.  Pt stable at time of discharge.  A: 1. Ectopic pregnancy without intrauterine pregnancy     P: D/C home with ectopic/bleeding precautions.  All communication done with [employer], hospital spanish interpreter. F/U in [facility] Thursday Oct 6 between 8-11 am for repeat quant hcg Return to MAU as needed for emergencies  LEFTWICH-KIRBY, LISA, CNM 2:18 PM

## 2015-01-21 NOTE — Progress Notes (Signed)
Dr. Shawnie Pons @ bedside discussing POC with pt.  POC includes need for surgery secondary ruptured ectopic pregnancy.  Informed consent given by MD with pt understanding verbalized.

## 2015-01-21 NOTE — MAU Provider Note (Signed)
S: 29 y.o. G1P0000  by LMP presents to MAU for repeat hcg.  She is on Day 7 following MTX therapy for ectopic pregnancy.  She initially reported no change to her LLQ mild abdominal pain but prior to discharge reported her pain had increased in the last 2 days.  She denies severe pain, n/v, or dizziness.    All communication done with [employer], hospital spanish interpreter.  Her quant hcg on 9/24 was 3447 and ultrasound showed right adnexal mass suspicious for ectopic pregnancy. She received MTX on 9/24.  On Day 4, 9/27, quant hcg was 3917.   O: BP 105/60 mmHg  Pulse 83  Temp(Src) 98.7 F (37.1 C) (Oral)  Resp 18  SpO2 100%  LMP 12/06/2014  Patient Vitals for the past 24 hrs:  BP Temp Temp src Pulse Resp SpO2  01/21/15 1409 105/60 mmHg 98.7 F (37.1 C) Oral 83 18 100 %  01/21/15 0938 102/60 mmHg - - 81 - -  01/21/15 0937 - 98.3 F (36.8 C) Oral - 16 -     Physical Examination: General appearance - alert, well appearing, and in no distress and oriented to person, place, and time Chest - clear to auscultation, no wheezes, rales or rhonchi, symmetric air entry Heart - normal rate, regular rhythm, normal S1, S2, no murmurs, rubs, clicks or gallops Abdomen - soft, nontender, nondistended, no masses or organomegaly Musculoskeletal - no joint tenderness, deformity or swelling, full range of motion without pain  Results for orders placed or performed during the hospital encounter of 01/21/15 (from the past 24 hour(s))  hCG, quantitative, pregnancy     Status: Abnormal   Collection Time: 01/21/15  9:39 AM  Result Value Ref Range   hCG, Beta Chain, Quant, S 2948 (H) <5 mIU/mL  CBC     Status: None   Collection Time: 01/21/15  2:25 PM  Result Value Ref Range   WBC 8.8 4.0 - 10.5 K/uL   RBC 4.10 3.87 - 5.11 MIL/uL   Hemoglobin 12.4 12.0 - 15.0 g/dL   HCT 16.1 09.6 - 04.5 %   MCV 88.5 78.0 - 100.0 fL   MCH 30.2 26.0 - 34.0 pg   MCHC 34.2 30.0 - 36.0 g/dL   RDW 40.9 81.1 - 91.4  %   Platelets 292 150 - 400 K/uL   IMAGING US Ob Transvaginal  01/21/2015   CLINICAL DATA:  Known ectopic pregnancy.  EXAM: TRANSVAGINAL OB ULTRASOUND  TECHNIQUE: Transvaginal ultrasound was performed for complete evaluation of the gestation as well as the maternal uterus, adnexal regions, and pelvic cul-de-sac.  COMPARISON:  01/15/2015  FINDINGS: Intrauterine gestational sac: None visualized  Yolk sac:  N/A  Embryo:  N/A  Maternal uterus/adnexae: Large amount of blood and clots noted within the pelvis suggesting ruptured ectopic pregnancy. The ectopic pregnancy is again noted measuring 1.6 x 1.5 x 1.2 cm.  IMPRESSION: No intrauterine gestation.  Complex free fluid in the pelvis compatible with blood. Blood clots noted. Findings most suspicious of ruptured right ectopic pregnancy.  Critical Value/emergent results were called by telephone at the time of interpretation on 01/21/2015 at 2:05 pm to LISA LEFTWICH-KIRBY , who verbally acknowledged these results.   Electronically Signed   By: Charlett Nose M.D.   On: 01/21/2015 14:07   MDM:  Presented pt quant hcg results to pt.  Due to pt report of increased pain, sent for OB US.  [place] results indicate ruptured ectopic pregnancy. Consult Dr Shawnie Pons.  Pt stable at  this time.  Prepare pt for OR at this time. Discussed ruptured ectopic and need for surgery with pt, given opportunity to ask questions.  Pt states understanding.   --/--/O POS (09/20 1823)  A: 1. Ectopic pregnancy- ruptured     P: Consult Dr Shawnie Pons to bedside to see pt, discuss surgery CBC, type and screen  LEFTWICH-KIRBY, LISA, CNM 2:18 PM

## 2015-01-21 NOTE — Transfer of Care (Signed)
Immediate Anesthesia Transfer of Care Note  Patient: Rebekah Stone  Procedure(s) Performed: Procedure(s): LAPAROSCOPY salpingectomy and right salpingostomy (N/A)  Patient Location: PACU  Anesthesia Type:General  Level of Consciousness: awake, alert  and oriented  Airway & Oxygen Therapy: Patient Spontanous Breathing and Patient connected to nasal cannula oxygen  Post-op Assessment: Report given to RN and Post -op Vital signs reviewed and stable  Post vital signs: Reviewed and stable  Last Vitals:  Filed Vitals:   01/21/15 1736  BP:   Pulse: 72  Temp: 36.9 C  Resp:     Complications: No apparent anesthesia complications

## 2015-01-21 NOTE — MAU Note (Signed)
Patient here for f/u blood work for ectopic pregnancy, patient reports pain is intermittent 5/10 when walking, having a small amount of vaginal bleeding. Eda Royal Spanish interpreter assisted in triage.

## 2015-01-21 NOTE — Anesthesia Postprocedure Evaluation (Signed)
  Anesthesia Post-op Note  Patient: Rebekah Stone  Procedure(s) Performed: Procedure(s): LAPAROSCOPY salpingectomy and right salpingostomy (N/A)  Patient Location: PACU  Anesthesia Type:General  Level of Consciousness: awake, alert  and oriented  Airway and Oxygen Therapy: Patient Spontanous Breathing  Post-op Pain: mild  Post-op Assessment: Post-op Vital signs reviewed, Patient's Cardiovascular Status Stable, Respiratory Function Stable, Patent Airway, No signs of Nausea or vomiting and Pain level controlled              Post-op Vital Signs: Reviewed and stable  Last Vitals:  Filed Vitals:   01/21/15 1815  BP:   Pulse:   Temp:   Resp: 12    Complications: No apparent anesthesia complications

## 2015-01-21 NOTE — Anesthesia Procedure Notes (Signed)
Procedure Name: Intubation Date/Time: 01/21/2015 4:12 PM Performed by: Graciela Husbands Pre-anesthesia Checklist: Patient identified, Emergency Drugs available, Suction available and Patient being monitored Patient Re-evaluated:Patient Re-evaluated prior to inductionOxygen Delivery Method: Circle system utilized Preoxygenation: Pre-oxygenation with 100% oxygen Intubation Type: IV induction, Rapid sequence and Cricoid Pressure applied Laryngoscope Size: Mac and 3 Grade View: Grade I Tube type: Oral Tube size: 7.0 mm Number of attempts: 1 Airway Equipment and Method: Patient positioned with wedge pillow,  Stylet and Bite block Placement Confirmation: ETT inserted through vocal cords under direct vision,  positive ETCO2,  CO2 detector and breath sounds checked- equal and bilateral Secured at: 23 cm Tube secured with: Tape Dental Injury: Teeth and Oropharynx as per pre-operative assessment

## 2015-01-21 NOTE — Anesthesia Preprocedure Evaluation (Signed)
Anesthesia Evaluation  Patient identified by MRN, date of birth, ID band Patient awake    Reviewed: Allergy & Precautions, NPO status , Patient's Chart, lab work & pertinent test results  Airway Mallampati: III  TM Distance: >3 FB Neck ROM: Full    Dental no notable dental hx. (+) Teeth Intact   Pulmonary asthma ,    Pulmonary exam normal breath sounds clear to auscultation       Cardiovascular negative cardio ROS Normal cardiovascular exam Rhythm:Regular Rate:Normal     Neuro/Psych negative neurological ROS  negative psych ROS   GI/Hepatic negative GI ROS, Neg liver ROS,   Endo/Other  negative endocrine ROS  Renal/GU negative Renal ROS  negative genitourinary   Musculoskeletal negative musculoskeletal ROS (+)   Abdominal   Peds  Hematology negative hematology ROS (+)   Anesthesia Other Findings   Reproductive/Obstetrics (+) Pregnancy Left ectopic pregnancy                             Anesthesia Physical Anesthesia Plan  ASA: II and emergent  Anesthesia Plan: General   Post-op Pain Management:    Induction: Intravenous, Rapid sequence and Cricoid pressure planned  Airway Management Planned: Oral ETT  Additional Equipment:   Intra-op Plan:   Post-operative Plan: Extubation in OR  Informed Consent: I have reviewed the patients History and Physical, chart, labs and discussed the procedure including the risks, benefits and alternatives for the proposed anesthesia with the patient or authorized representative who has indicated his/her understanding and acceptance.   Dental advisory given  Plan Discussed with: CRNA, Anesthesiologist and Surgeon  Anesthesia Plan Comments:         Anesthesia Quick Evaluation

## 2015-01-24 ENCOUNTER — Encounter (HOSPITAL_COMMUNITY): Payer: Self-pay | Admitting: Family Medicine

## 2015-01-27 ENCOUNTER — Telehealth: Payer: Self-pay | Admitting: *Deleted

## 2015-01-27 ENCOUNTER — Other Ambulatory Visit: Payer: Self-pay

## 2015-01-27 DIAGNOSIS — O209 Hemorrhage in early pregnancy, unspecified: Secondary | ICD-10-CM

## 2015-01-27 LAB — HCG, QUANTITATIVE, PREGNANCY: HCG, BETA CHAIN, QUANT, S: 75 m[IU]/mL — AB (ref <5)

## 2015-01-27 NOTE — Telephone Encounter (Signed)
Contacted patient with Rebekah Stone, Spanish Interpreter.  Informed patient of Bhcg results and Dr. Denyce Robert recommendation.  Pt does not need any additional lab work.  Pt verbalizes understanding.

## 2015-02-02 ENCOUNTER — Telehealth: Payer: Self-pay | Admitting: General Practice

## 2015-02-02 NOTE — Telephone Encounter (Signed)
Per Dr Adrian BlackwaterStinson, patient needs bhcg. Called patient with Darl PikesSusan for interpreter and informed patient. Patient states she was informed last week did not need further follow up. Had Walidah review patient's chart and she states to follow up with provider two weeks from surgery. Informed patient. Patient verbalized understanding. Darl PikesSusan will call patient back shortly to schedule appt for next week. Patient had no questions at this time

## 2015-02-09 ENCOUNTER — Encounter: Payer: Self-pay | Admitting: Obstetrics & Gynecology

## 2015-02-09 ENCOUNTER — Ambulatory Visit (INDEPENDENT_AMBULATORY_CARE_PROVIDER_SITE_OTHER): Payer: Self-pay | Admitting: Obstetrics & Gynecology

## 2015-02-09 VITALS — BP 109/69 | HR 79 | Resp 16 | Wt 138.0 lb

## 2015-02-09 DIAGNOSIS — Z309 Encounter for contraceptive management, unspecified: Secondary | ICD-10-CM

## 2015-02-09 DIAGNOSIS — Z3009 Encounter for other general counseling and advice on contraception: Secondary | ICD-10-CM

## 2015-02-09 DIAGNOSIS — Z8759 Personal history of other complications of pregnancy, childbirth and the puerperium: Secondary | ICD-10-CM

## 2015-02-09 MED ORDER — NORGESTIM-ETH ESTRAD TRIPHASIC 0.18/0.215/0.25 MG-25 MCG PO TABS: 1.0000 | ORAL_TABLET | Freq: Every day | ORAL | Status: AC

## 2015-02-09 NOTE — Patient Instructions (Signed)
Uso de los anticonceptivos orales (Oral Contraception Use) Los anticonceptivos orales (ACO) son medicamentos que se utilizan para evitar el embarazo. Su funcin es evitar que los ovarios liberen vulos. Las hormonas de los ACO tambin hacen que el moco cervical se haga ms espeso, lo que evita que el esperma ingrese al tero. Tambin hacen que la membrana que recubre internamente al tero se vuelva ms fina, lo que no permite que el huevo fertilizado se adhiera a la pared del tero. Los ACO son muy efectivos cuando se toman exactamente como se prescriben. Sin embargo, no previenen contra las enfermedades de transmisin sexual (ETS). La prctica del sexo seguro, como el uso de preservativos, junto con los ACO, ayudan a prevenir ese tipo de enfermedades. Antes de tomar ACO, debe hacerse un examen fsico y un Papanicolau. El mdico podr indicarle anlisis de sangre, si es necesario. El mdico se asegurar de que usted sea una buena candidata para usar anticonceptivos orales. Converse con su mdico acerca de los posibles efectos secundarios de los ACO que podran recetarle. Cuando se inicia el uso de ACO, se pueden tomar durante 2 a 3 meses para que el cuerpo se adapte a los cambios en los niveles hormonales en el cuerpo.  CMO TOMAR LOS ANTICONCEPTIVOS ORALES El mdico le indicar como comenzar a tomar el primer ciclo de ACO. De lo contrario usted puede:   Comenzar el da de inicio del ciclo menstrual. No necesitar proteccin anticonceptiva adicional al comenzar en este momento.   Comenzar el primer domingo luego de su perodo menstrual, o el da en que adquiere el medicamento. En estos casos deber tener proteccin anticonceptiva adicional durante los primeros 7 das del ciclo.   Comenzar a tomarlos en cualquier momento del ciclo. Si toma el anticonceptivo dentro de los 5 das de iniciado el perodo, estar protegida de quedar embarazada inmediatamente. En este caso, no necesitar una forma adicional de  anticonceptivos. Si comienza en cualquier otro momento del ciclo menstrual, necesitar usar otra forma de anticonceptivo durante 7 das. Si sus ACO son del tipo de los llamados minipldoras, podrn impedir el embarazo despus de tomarlas por 2 das (48 horas). Luego de comenzar a tomar los ACO:   Si olvid de tomar 1 pldora, tmela tan pronto como lo recuerde. Tome la siguiente pldora a la hora habitual.   Si dej de tomar 2 o ms pldoras, comunquese con su mdico ya que diferentes pldoras tienen diferentes instrucciones para las dosis que no se han tomado. Si olvida tomar 2 o ms pldoras, utilice un mtodo anticonceptivo adicional hasta que comience su prximo perodo menstrual.   Si utiliza el envase de 28 pldoras que contienen pldoras inactivas y olvida tomar 1 de las ltimas 7 (pldoras sin hormonas), sto no tiene importancia. Simplemente deseche el resto de las pldoras que no contienen hormonas y comience un nuevo envase.  No importa cuando comience a tomar los anticonceptivos, siempre empiece un nuevo envase el mismo da de la semana. Tenga un envase extra de ACO y use un mtodo anticonceptivo adicional para el caso en que se olvide de tomar algunas pldoras o pierda la caja.  INSTRUCCIONES PARA EL CUIDADO EN EL HOGAR   No fume.   Use siempre un condn para protegerse contra las enfermedades de transmisin sexual. Los ACO no protegen contra las enfermedades de transmisin sexual.   Use un almanaque para marcar los das de su perodo menstrual.   Lea la informacin y consejos que vienen con las ACO. Hable con   el profesional si tiene dudas.  SOLICITE ATENCIN MDICA SI:   Presenta nuseas o vmitos.   Tiene flujo o sangrado vaginal anormal.   Aparece una erupcin cutnea.   No tiene el perodo menstrual.   Pierde el cabello.   Necesita tratamiento por cambios en su estado de nimo o por depresin.   Se siente mareada al tomar los ACO.   Comienza a  aparecer acn con el uso de los ACO.   Queda embarazada.  SOLICITE ATENCIN MDICA DE INMEDIATO SI:   Siente dolor en el pecho.   Le falta el aire.   Le duele mucho la cabeza y no puede controlar el dolor.   Siente adormecimiento o tiene dificultad para hablar.   Tiene problemas de visin.   Presenta dolor, inflamacin o hinchazn en las piernas.    Esta informacin no tiene como fin reemplazar el consejo del mdico. Asegrese de hacerle al mdico cualquier pregunta que tenga.   Document Released: 03/29/2011 Document Revised: 12/10/2012 Elsevier Interactive Patient Education 2016 [employer].  

## 2015-02-09 NOTE — Progress Notes (Signed)
Patient ID: Rebekah Stone, female   DOB: [birth year], 29 y.o.   MRN: [id number] Pt here for follow up. Reports having pain at umbilicus with pain scale 4/10. Pt has not taken Percocet because of feeling of nausea. Pain is not constant and has not needed pain medicine.

## 2015-02-09 NOTE — Progress Notes (Signed)
Subjective:epigastic pain and rash     Rebekah Stone is a 29 y.o. female who presents to the clinic 3 weeks status post left salpingectomy and right salpingostomy for bilateral tubal ectopic pregnancy. Eating a regular diet without difficulty. Bowel movements are normal. Pain is controlled without any medications. Slight fine rash on cheeks and chest The following portions of the patient's history were reviewed and updated as appropriate: allergies, current medications, past family history, past medical history, past social history, past surgical history and problem list.  Review of Systems Pertinent items are noted in HPI.    Objective:    BP 109/69 mmHg  Pulse 79  Resp 16  Wt 138 lb (62.596 kg)  LMP 12/06/2014  Breastfeeding? Unknown General:  alert, cooperative and no distress  Abdomen: soft, bowel sounds active, non-tender  Incision:   healing well, no drainage, no erythema, no hernia, no seroma, no swelling, no dehiscence, incision well approximated     Assessment:    Doing well postoperatively. Operative findings again reviewed. Pathology report discussed.    Plan:    1. Continue any current medications. 2. Wound care discussed. 3. Activity restrictions: none 4. Anticipated return to work: 1-2 weeks. 5. Follow up:as needed 6.OrthoTricylen rx  Adam PhenixJames G Arnold, MD 02/09/2015

## 2015-02-18 LAB — GLUCOSE, POCT (MANUAL RESULT ENTRY): POC GLUCOSE: 115 mg/dL — AB (ref 70–99)

## 2017-09-18 IMAGING — US US OB COMP LESS 14 WK
1 series · 14 of 28 positions shown · non-contrast
Comparison: None.

CLINICAL DATA: 29-year-old female with positive pregnancy test
presenting with vaginal bleeding and increasing pelvic pain.

EXAM:
OBSTETRIC <14 WK US AND TRANSVAGINAL OB US
TECHNIQUE: Both transabdominal and transvaginal ultrasound examinations were
performed for complete evaluation of the gestation as well as the
maternal uterus, adnexal regions, and pelvic cul-de-sac.
Transvaginal technique was performed to assess early pregnancy.

[Series 1: us ob comp less 14 wk · 14 of 108 slices shown]
[im 4/108]
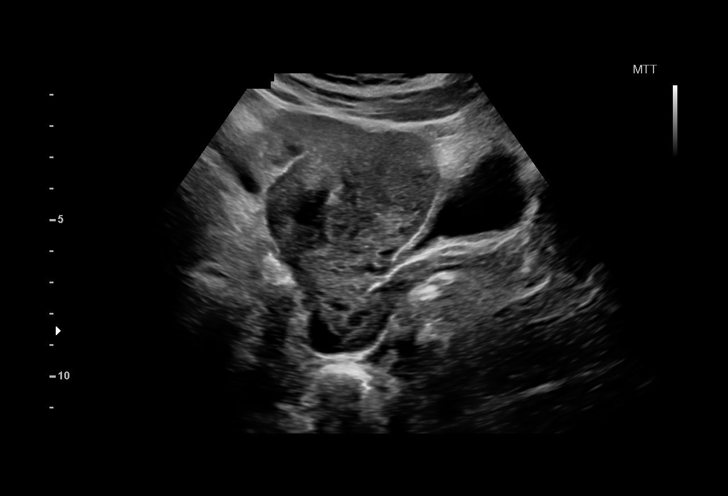
[im 12/108]
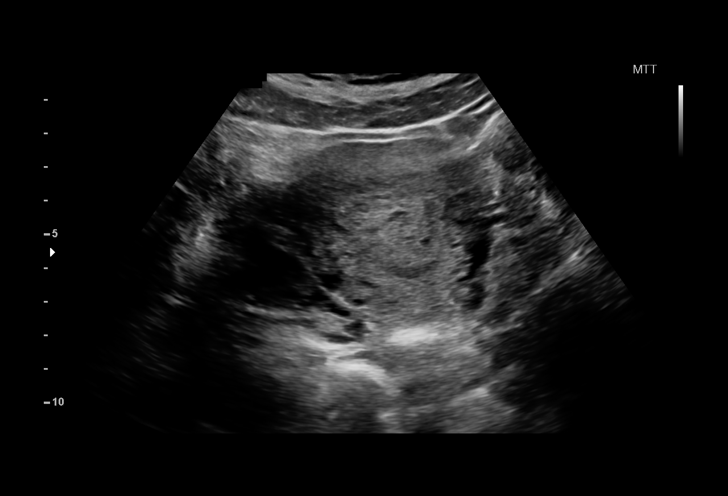
[im 20/108]
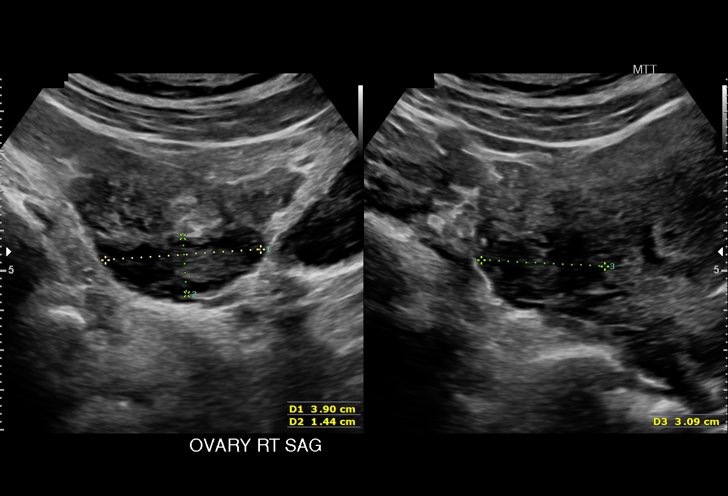
[im 28/108]
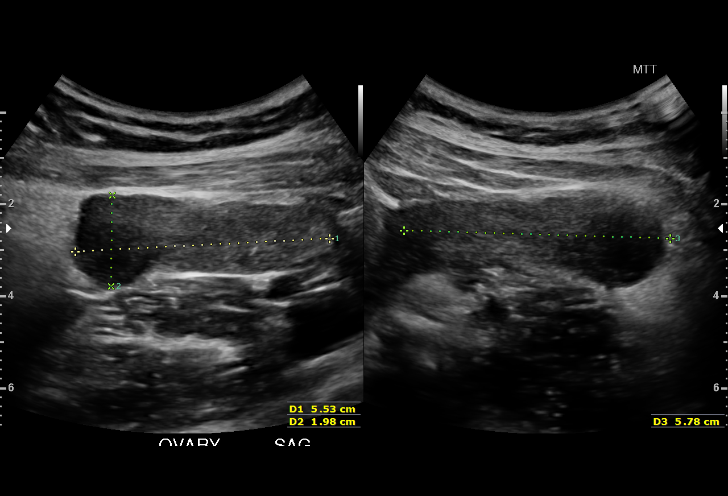
[im 36/108]
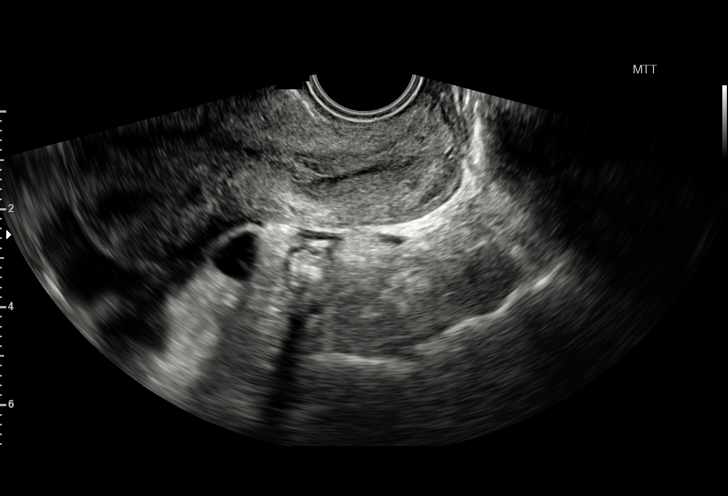
[im 44/108]
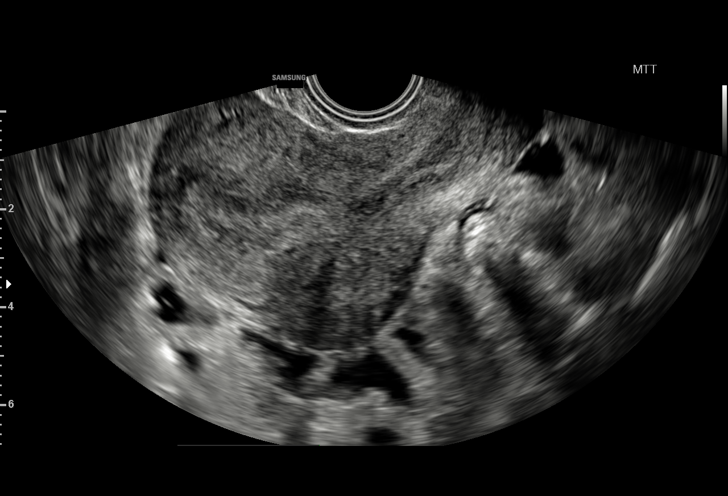
[im 52/108]
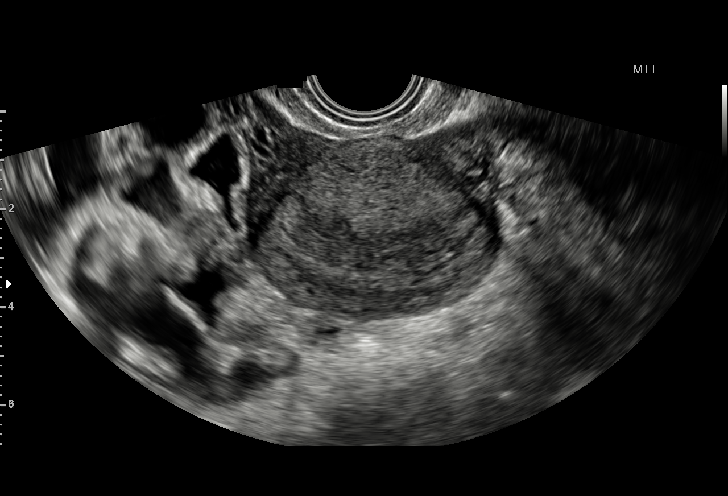
[im 60/108]
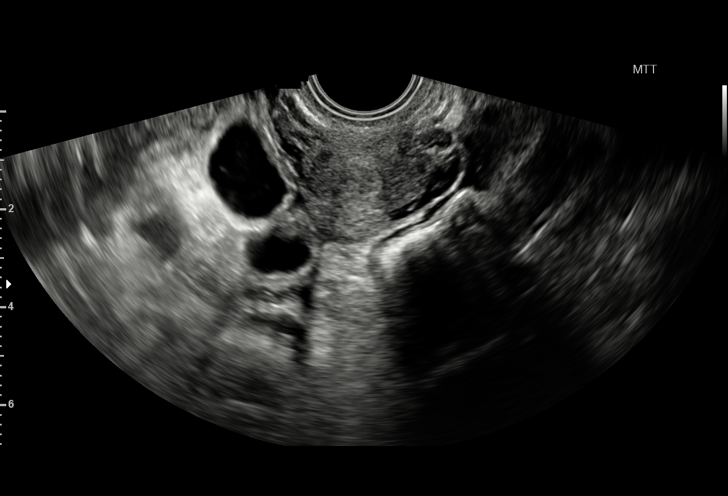
[im 68/108]
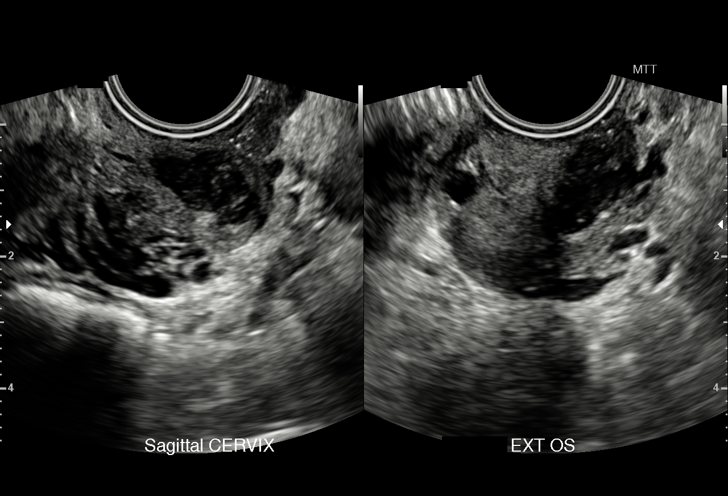
[im 76/108]
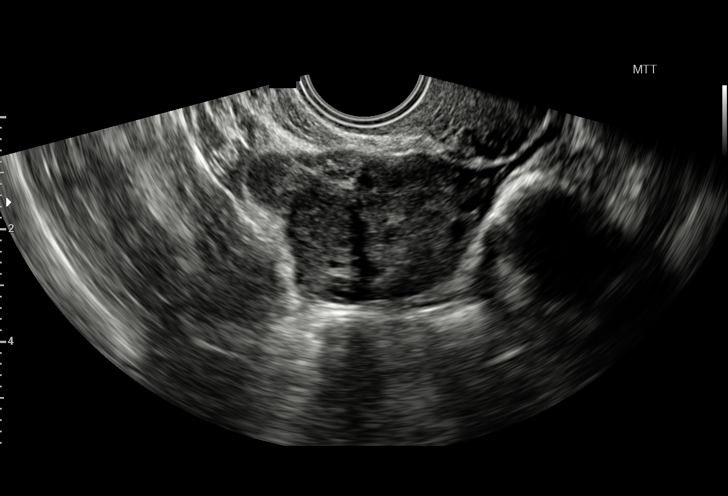
[im 84/108]
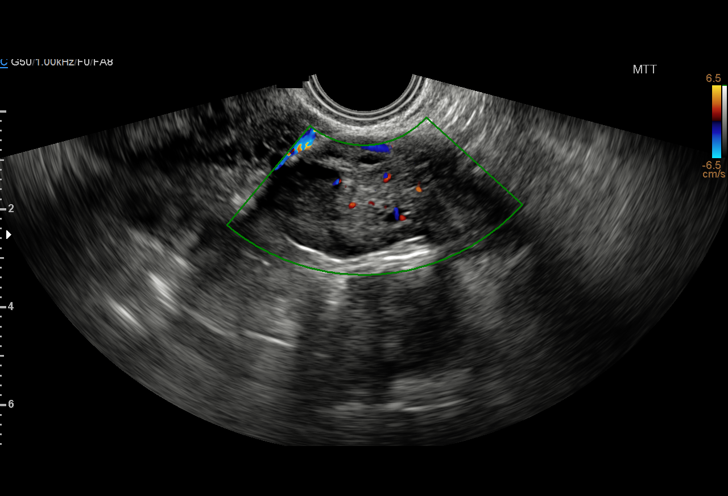
[im 92/108]
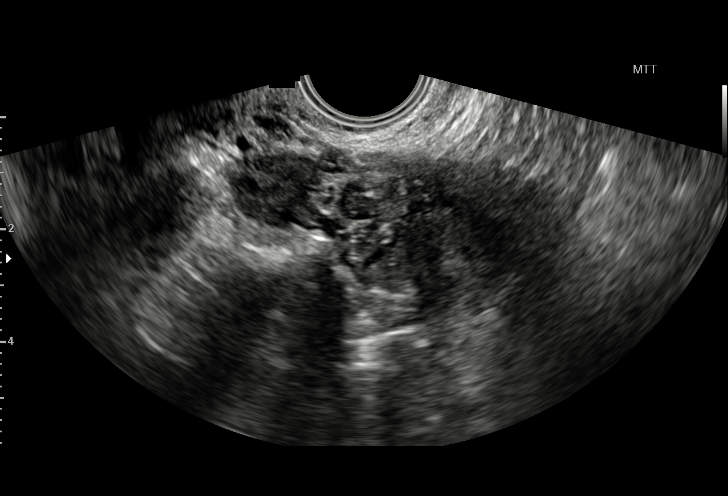
[im 100/108]
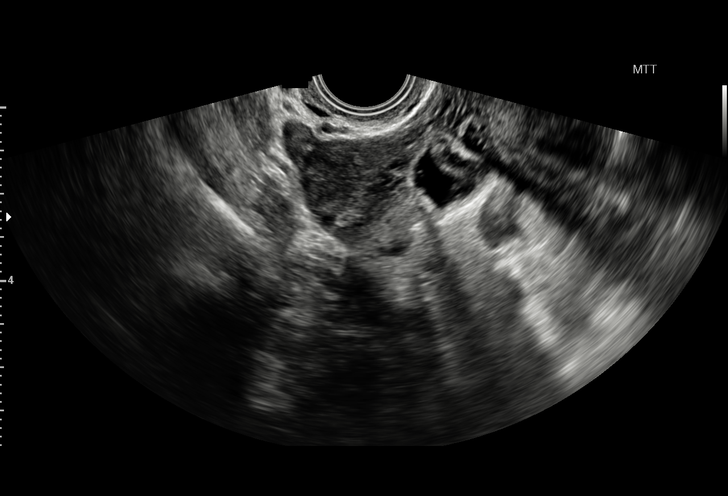
[im 108/108]
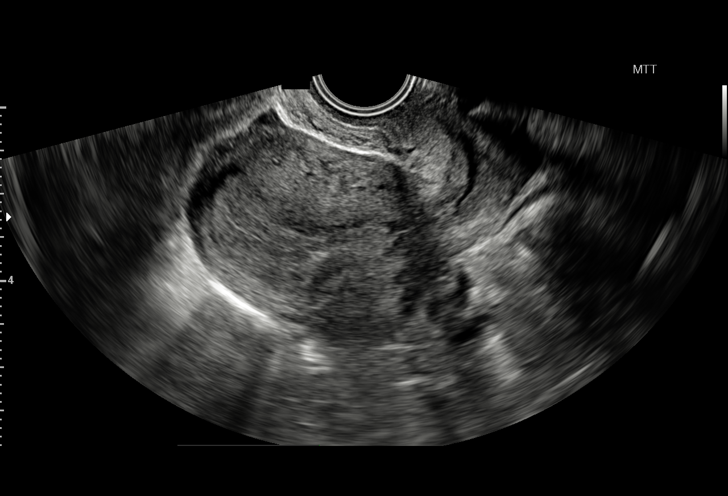

[14 of 28 positions shown; findings below may reference images not displayed]

FINDINGS: The uterus is anteverted and demonstrates a heterogeneous
echotexture. No intrauterine pregnancy identified. In the setting of
a positive HCG level and absent intrauterine pregnancy and ectopic
pregnancy is not entirely excluded. Correlation with clinical exam
and follow-up with serial HCG levels and ultrasound recommended.

A 3.0 x 2.8 x 3.3 cm solid lesion noted in the right uterus most
compatible with a fibroid. There is a 1.9 x 1.1 x 1.6 cm
heterogeneous and hypoechoic lesion to the left of external cervical
os. This lesion demonstrates cystic component with areas of solid
tissue or septation. Doppler images demonstrate flow to the solid
component/ septation of this lesion. Correlation with gynecological
exam recommended. Further evaluation with MRI is advised for further
characterization.

The right ovary measures 4.4 x 2.5 x 2.9 cm and the left ovary
measures 4.2 x 2.5 x 0.9 cm. There are bilateral complex/hypoechoic
ovarian lesions most likely corpus luteum or hemorrhagic cysts.
Ectopic pregnancy less likely. Correlation with clinical exam and
location of pain recommended.

Small free fluid within the pelvis
IMPRESSION: No intrauterine pregnancy identified. Ectopic pregnancy not entirely
excluded. Correlation with clinical exam and follow-up with serial
HCG levels and ultrasound recommended.

Follow-up bilateral corpus luteum and hemorrhagic. Ectopic pregnancy
is less likely. Correlation with clinical exam and location of pain
recommended.

Complex predominantly hypoechoic lesion to the left of the external
os. Correlation with gynecological exam and MRI recommended.

Right ovarian fibroid.

## 2017-09-28 IMAGING — US US OB TRANSVAGINAL
1 series · 15 of 28 positions shown · non-contrast
Comparison: 01/15/2015

CLINICAL DATA: Known ectopic pregnancy.

EXAM:
TRANSVAGINAL OB ULTRASOUND
TECHNIQUE: Transvaginal ultrasound was performed for complete evaluation of the
gestation as well as the maternal uterus, adnexal regions, and
pelvic cul-de-sac.

[Series 1: us ob transvaginal · 29 acquisitions, 15 frames shown]
[im 1/29]
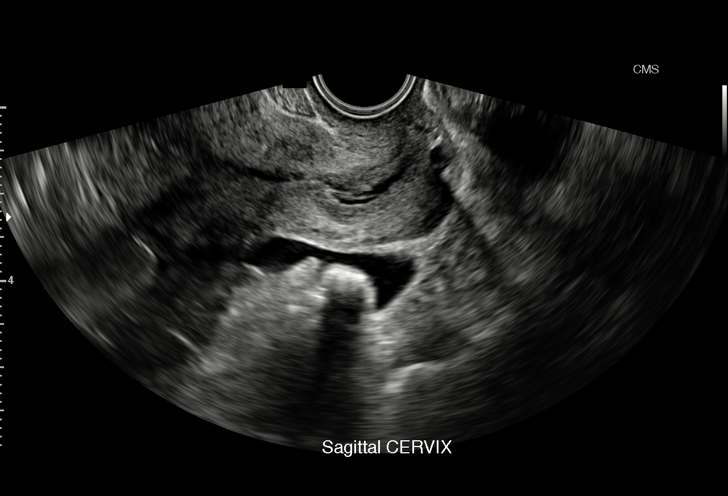
[im 3/29]
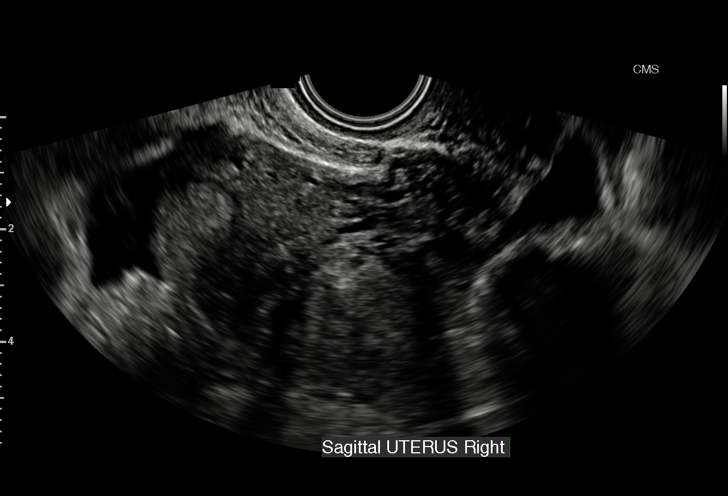
[im 5/29]
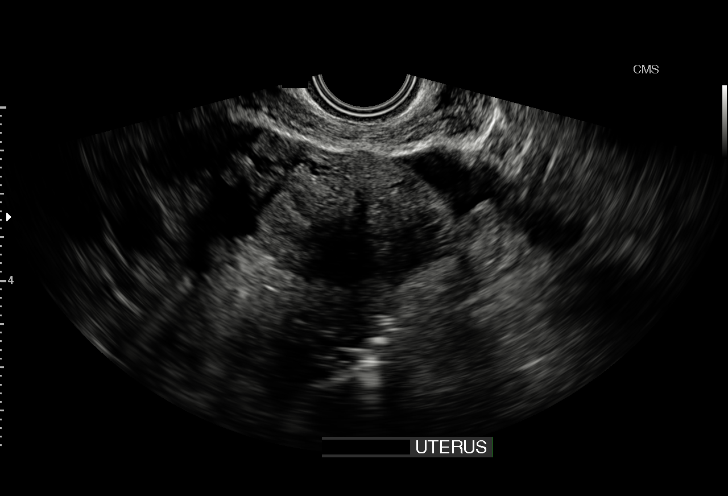
[im 7/29]
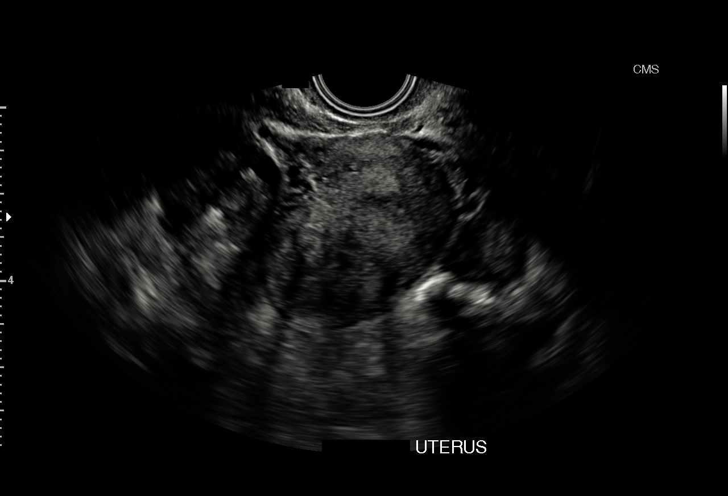
[im 9/29]
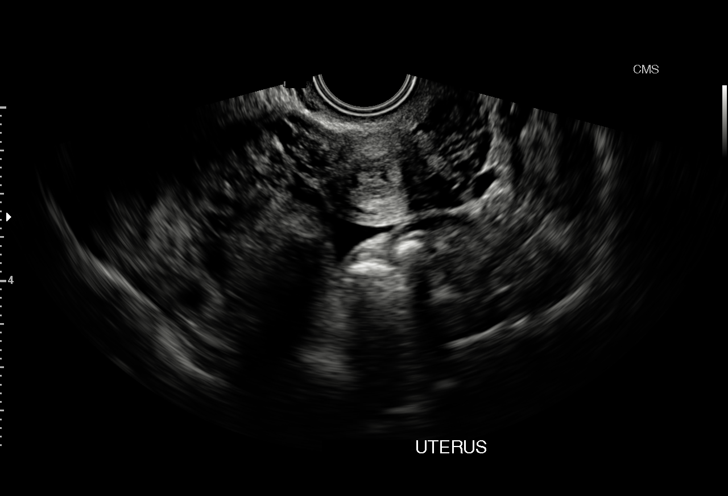
[im 11/29]
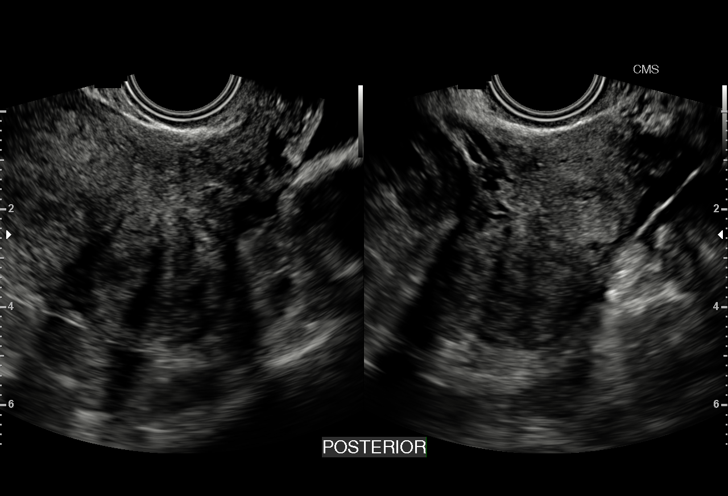
[im 13/29]
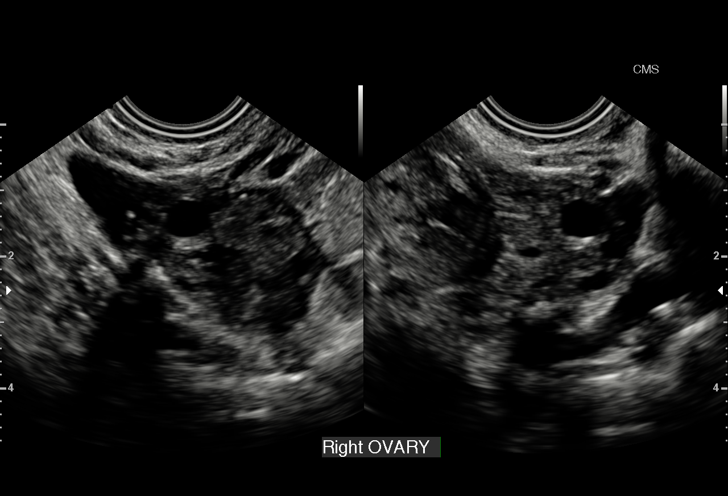
[im 15/29]
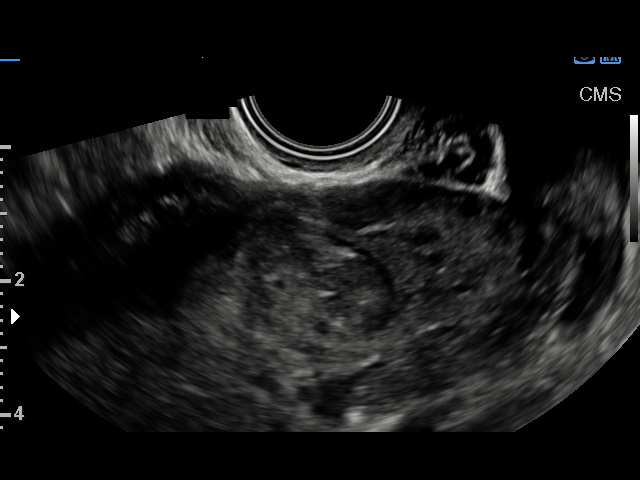
[im 16/29]
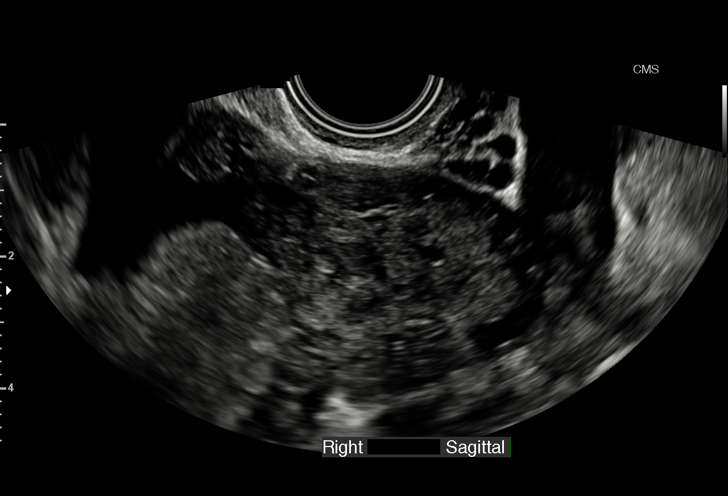
[im 18/29]
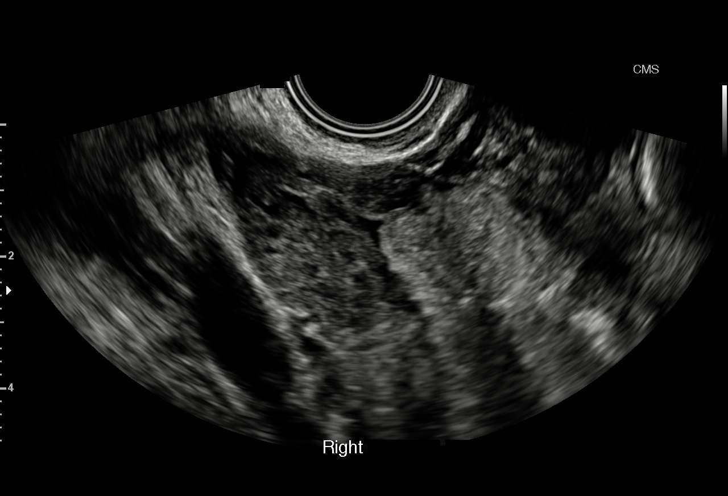
[im 20/29]
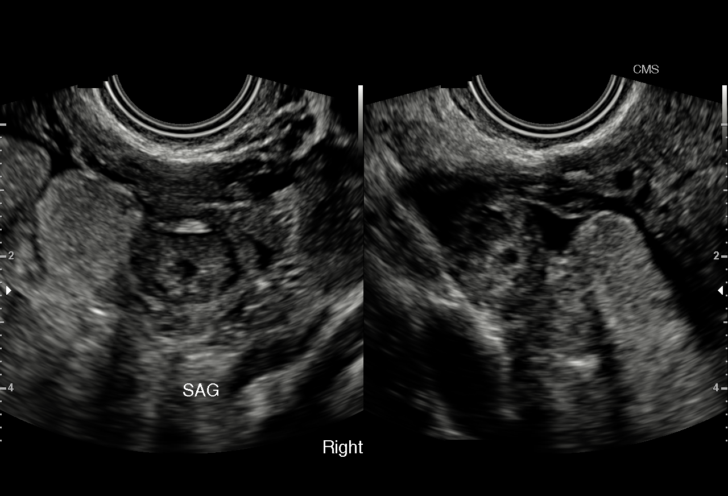
[im 22/29]
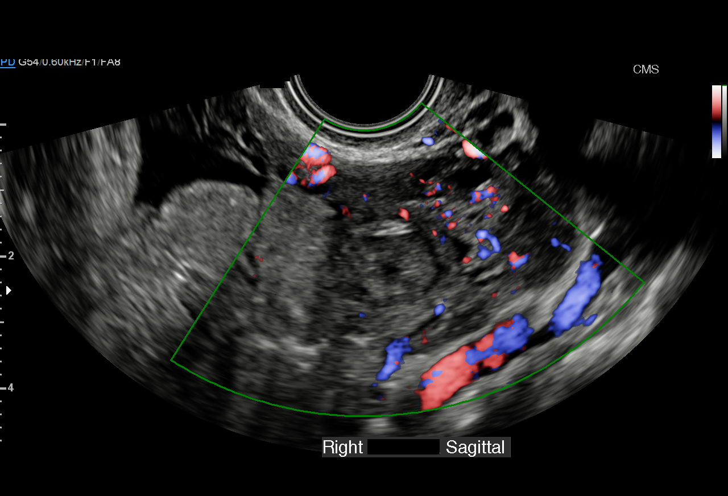
[im 24/29]
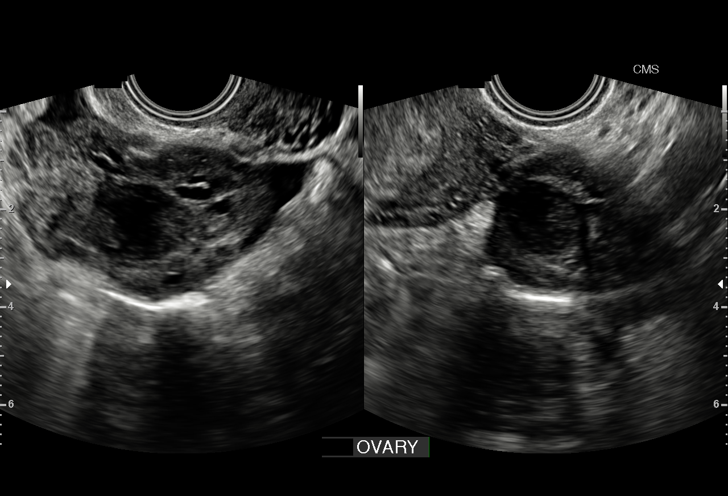
[im 26/29]
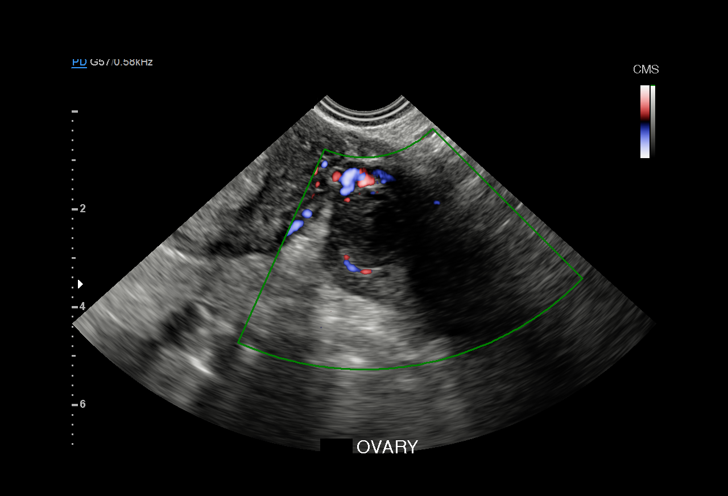
[im 29/29]
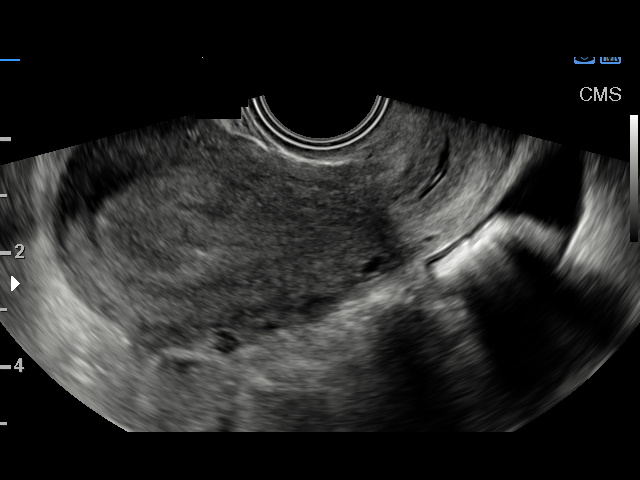

[15 of 28 positions shown; findings below may reference images not displayed]

FINDINGS: Intrauterine gestational sac: None visualized

Yolk sac:  N/A

Embryo:  N/A

Maternal uterus/adnexae: Large amount of blood and clots noted
within the pelvis suggesting ruptured ectopic pregnancy. The ectopic
pregnancy is again noted measuring 1.6 x 1.5 x 1.2 cm.
IMPRESSION: No intrauterine gestation.

Complex free fluid in the pelvis compatible with blood. Blood clots
noted. Findings most suspicious of ruptured right ectopic pregnancy.

Critical Value/emergent results were called by telephone at the time
of interpretation on 01/21/2015 at [DATE] to CHIKITO HUNT ,
who verbally acknowledged these results.
